# Patient Record
Sex: Male | Born: 1945 | Race: White | Hispanic: No | Marital: Married | Smoking: Never smoker
Health system: Southern US, Community
[De-identification: ages and names within clinical notes are randomized; demographics above are authoritative.]

## PROBLEM LIST (undated history)

## (undated) DIAGNOSIS — R1310 Dysphagia, unspecified: Secondary | ICD-10-CM

## (undated) DIAGNOSIS — I6521 Occlusion and stenosis of right carotid artery: Secondary | ICD-10-CM

## (undated) DIAGNOSIS — E785 Hyperlipidemia, unspecified: Secondary | ICD-10-CM

## (undated) DIAGNOSIS — R42 Dizziness and giddiness: Secondary | ICD-10-CM

## (undated) DIAGNOSIS — I493 Ventricular premature depolarization: Secondary | ICD-10-CM

## (undated) DIAGNOSIS — R600 Localized edema: Secondary | ICD-10-CM

## (undated) DIAGNOSIS — E039 Hypothyroidism, unspecified: Secondary | ICD-10-CM

## (undated) DIAGNOSIS — K5909 Other constipation: Secondary | ICD-10-CM

## (undated) DIAGNOSIS — I10 Essential (primary) hypertension: Secondary | ICD-10-CM

## (undated) DIAGNOSIS — E538 Deficiency of other specified B group vitamins: Secondary | ICD-10-CM

## (undated) DIAGNOSIS — Z6828 Body mass index (BMI) 28.0-28.9, adult: Secondary | ICD-10-CM

## (undated) DIAGNOSIS — F419 Anxiety disorder, unspecified: Secondary | ICD-10-CM

## (undated) DIAGNOSIS — R55 Syncope and collapse: Secondary | ICD-10-CM

## (undated) DIAGNOSIS — Z87442 Personal history of urinary calculi: Secondary | ICD-10-CM

## (undated) DIAGNOSIS — N1831 Chronic kidney disease, stage 3a: Secondary | ICD-10-CM

## (undated) DIAGNOSIS — J309 Allergic rhinitis, unspecified: Secondary | ICD-10-CM

## (undated) DIAGNOSIS — N400 Enlarged prostate without lower urinary tract symptoms: Secondary | ICD-10-CM

## (undated) DIAGNOSIS — M159 Polyosteoarthritis, unspecified: Secondary | ICD-10-CM

## (undated) DIAGNOSIS — R0600 Dyspnea, unspecified: Secondary | ICD-10-CM

## (undated) DIAGNOSIS — Z6823 Body mass index (BMI) 23.0-23.9, adult: Secondary | ICD-10-CM

## (undated) DIAGNOSIS — E291 Testicular hypofunction: Secondary | ICD-10-CM

## (undated) HISTORY — DX: Localized edema: R60.0

## (undated) HISTORY — DX: Chronic kidney disease, stage 3a: N18.31

## (undated) HISTORY — DX: Syncope and collapse: R55

## (undated) HISTORY — DX: Essential (primary) hypertension: I10

## (undated) HISTORY — DX: Dyspnea, unspecified: R06.00

## (undated) HISTORY — DX: Dizziness and giddiness: R42

## (undated) HISTORY — DX: Benign prostatic hyperplasia without lower urinary tract symptoms: N40.0

## (undated) HISTORY — DX: Polyosteoarthritis, unspecified: M15.9

## (undated) HISTORY — DX: Body mass index (BMI) 28.0-28.9, adult: Z68.28

## (undated) HISTORY — DX: Testicular hypofunction: E29.1

## (undated) HISTORY — DX: Body mass index (BMI) 23.0-23.9, adult: Z68.23

## (undated) HISTORY — DX: Hyperlipidemia, unspecified: E78.5

## (undated) HISTORY — DX: Occlusion and stenosis of right carotid artery: I65.21

## (undated) HISTORY — DX: Anxiety disorder, unspecified: F41.9

## (undated) HISTORY — PX: KIDNEY STONE SURGERY: SHX686

## (undated) HISTORY — DX: Other constipation: K59.09

## (undated) HISTORY — DX: Allergic rhinitis, unspecified: J30.9

## (undated) HISTORY — DX: Hypothyroidism, unspecified: E03.9

## (undated) HISTORY — DX: Dysphagia, unspecified: R13.10

## (undated) HISTORY — DX: Ventricular premature depolarization: I49.3

## (undated) HISTORY — DX: Deficiency of other specified B group vitamins: E53.8

---

## 2014-12-30 DIAGNOSIS — B351 Tinea unguium: Secondary | ICD-10-CM | POA: Diagnosis not present

## 2014-12-30 DIAGNOSIS — Z6828 Body mass index (BMI) 28.0-28.9, adult: Secondary | ICD-10-CM | POA: Diagnosis not present

## 2014-12-30 DIAGNOSIS — R030 Elevated blood-pressure reading, without diagnosis of hypertension: Secondary | ICD-10-CM | POA: Diagnosis not present

## 2014-12-30 DIAGNOSIS — R5382 Chronic fatigue, unspecified: Secondary | ICD-10-CM | POA: Diagnosis not present

## 2014-12-30 DIAGNOSIS — R410 Disorientation, unspecified: Secondary | ICD-10-CM | POA: Diagnosis not present

## 2015-01-10 DIAGNOSIS — H524 Presbyopia: Secondary | ICD-10-CM | POA: Diagnosis not present

## 2015-01-10 DIAGNOSIS — H40003 Preglaucoma, unspecified, bilateral: Secondary | ICD-10-CM | POA: Diagnosis not present

## 2015-01-10 DIAGNOSIS — H5210 Myopia, unspecified eye: Secondary | ICD-10-CM | POA: Diagnosis not present

## 2015-07-21 DIAGNOSIS — H40003 Preglaucoma, unspecified, bilateral: Secondary | ICD-10-CM | POA: Diagnosis not present

## 2015-11-10 DIAGNOSIS — R5382 Chronic fatigue, unspecified: Secondary | ICD-10-CM | POA: Diagnosis not present

## 2015-11-10 DIAGNOSIS — R410 Disorientation, unspecified: Secondary | ICD-10-CM | POA: Diagnosis not present

## 2015-11-10 DIAGNOSIS — Z6828 Body mass index (BMI) 28.0-28.9, adult: Secondary | ICD-10-CM | POA: Diagnosis not present

## 2015-11-10 DIAGNOSIS — R030 Elevated blood-pressure reading, without diagnosis of hypertension: Secondary | ICD-10-CM | POA: Diagnosis not present

## 2015-11-10 DIAGNOSIS — Z1389 Encounter for screening for other disorder: Secondary | ICD-10-CM | POA: Diagnosis not present

## 2015-11-10 DIAGNOSIS — E663 Overweight: Secondary | ICD-10-CM | POA: Diagnosis not present

## 2016-09-07 DIAGNOSIS — M25511 Pain in right shoulder: Secondary | ICD-10-CM | POA: Diagnosis not present

## 2016-09-07 DIAGNOSIS — Z6827 Body mass index (BMI) 27.0-27.9, adult: Secondary | ICD-10-CM | POA: Diagnosis not present

## 2016-09-07 DIAGNOSIS — R030 Elevated blood-pressure reading, without diagnosis of hypertension: Secondary | ICD-10-CM | POA: Diagnosis not present

## 2016-09-07 DIAGNOSIS — E663 Overweight: Secondary | ICD-10-CM | POA: Diagnosis not present

## 2017-10-05 DIAGNOSIS — M79605 Pain in left leg: Secondary | ICD-10-CM | POA: Diagnosis not present

## 2017-10-05 DIAGNOSIS — M25552 Pain in left hip: Secondary | ICD-10-CM | POA: Diagnosis not present

## 2017-10-05 DIAGNOSIS — R030 Elevated blood-pressure reading, without diagnosis of hypertension: Secondary | ICD-10-CM | POA: Diagnosis not present

## 2017-10-05 DIAGNOSIS — Z6828 Body mass index (BMI) 28.0-28.9, adult: Secondary | ICD-10-CM | POA: Diagnosis not present

## 2017-12-08 DIAGNOSIS — I493 Ventricular premature depolarization: Secondary | ICD-10-CM | POA: Diagnosis not present

## 2017-12-08 DIAGNOSIS — R030 Elevated blood-pressure reading, without diagnosis of hypertension: Secondary | ICD-10-CM | POA: Diagnosis not present

## 2017-12-08 DIAGNOSIS — J309 Allergic rhinitis, unspecified: Secondary | ICD-10-CM | POA: Diagnosis not present

## 2017-12-08 DIAGNOSIS — R5382 Chronic fatigue, unspecified: Secondary | ICD-10-CM | POA: Diagnosis not present

## 2017-12-08 DIAGNOSIS — R002 Palpitations: Secondary | ICD-10-CM | POA: Diagnosis not present

## 2017-12-08 DIAGNOSIS — Z6828 Body mass index (BMI) 28.0-28.9, adult: Secondary | ICD-10-CM | POA: Diagnosis not present

## 2017-12-14 DIAGNOSIS — R002 Palpitations: Secondary | ICD-10-CM | POA: Diagnosis not present

## 2017-12-23 ENCOUNTER — Other Ambulatory Visit: Payer: Self-pay

## 2018-01-09 DIAGNOSIS — Z6827 Body mass index (BMI) 27.0-27.9, adult: Secondary | ICD-10-CM | POA: Diagnosis not present

## 2018-01-09 DIAGNOSIS — R030 Elevated blood-pressure reading, without diagnosis of hypertension: Secondary | ICD-10-CM | POA: Diagnosis not present

## 2018-01-09 DIAGNOSIS — J309 Allergic rhinitis, unspecified: Secondary | ICD-10-CM | POA: Diagnosis not present

## 2018-01-09 DIAGNOSIS — I493 Ventricular premature depolarization: Secondary | ICD-10-CM | POA: Diagnosis not present

## 2018-01-10 ENCOUNTER — Telehealth: Payer: Self-pay | Admitting: *Deleted

## 2018-01-10 NOTE — Telephone Encounter (Signed)
Dr. Tomie Chinaevankar advised that patient needed an appointment as Dr. Nedra HaiLee is referring patient to be seen by Dr. Tomie Chinaevankar. Patient has already been scheduled for an appointment on 01/16/18 at 10:20. Provided address to patient. No further questions.

## 2018-01-16 ENCOUNTER — Ambulatory Visit (INDEPENDENT_AMBULATORY_CARE_PROVIDER_SITE_OTHER): Payer: Medicare HMO | Admitting: Cardiology

## 2018-01-16 ENCOUNTER — Encounter: Payer: Self-pay | Admitting: Cardiology

## 2018-01-16 VITALS — BP 126/72 | HR 67 | Ht 74.0 in | Wt 204.0 lb

## 2018-01-16 DIAGNOSIS — I493 Ventricular premature depolarization: Secondary | ICD-10-CM

## 2018-01-16 DIAGNOSIS — R0609 Other forms of dyspnea: Secondary | ICD-10-CM | POA: Diagnosis not present

## 2018-01-16 DIAGNOSIS — R0989 Other specified symptoms and signs involving the circulatory and respiratory systems: Secondary | ICD-10-CM

## 2018-01-16 HISTORY — DX: Ventricular premature depolarization: I49.3

## 2018-01-16 NOTE — Progress Notes (Signed)
Cardiology Office Note:    Date:  01/16/2018   ID:  Robert Roy, DOB 11/01/1945, MRN [id number]  PCP:  Simone Curia, MD  Cardiologist:  Garwin Brothers, MD   Referring MD: Simone Curia, MD    ASSESSMENT:    1. PVC (premature ventricular contraction)   2. Carotid bruit, unspecified laterality   3. DOE (dyspnea on exertion)    PLAN:    In order of problems listed above:  1. Primary prevention stressed with the patient.  Importance of compliance with diet and medications stressed and he vocalized understanding.  His blood pressure is stable.  He gives some history suggesting orthostatic hypotension especially when she tries to get up from laying down position.  I asked him to keep himself well-hydrated and use a little extra salt in his diet in the warm summer months especially when he works out outside. 2. He will have bilateral carotid Doppler to evaluate his bruit.  Echocardiogram will be done to assess murmur on auscultation 3. The patient will undergo exercise stress echo to assess symptoms of dyspnea on exertion.  He does not appear to be concerning for coronary etiology based on his description.  He has no chest pain or chest tightness. 4. His blood work is followed by his primary care physician.  He knows to go to the nearest emergency room for any concerning symptoms.  He will be seen in follow-up appointment in a month or earlier if he has any concerns I reviewed his Holter monitor also and answered questions to his satisfaction   Medication Adjustments/Labs and Tests Ordered: Current medicines are reviewed at length with the patient today.  Concerns regarding medicines are outlined above.  No orders of the defined types were placed in this encounter.  No orders of the defined types were placed in this encounter.    No chief complaint on file.    History of Present Illness:    Robert Roy is a 72 y.o. male..  Patient is a pleasant 72 year old male.  He has past medical  history that is not much significant.  He is an active gentleman.  He mentions to me that he noticed a skipped beat like sensation and was found to have multiple PVCs on the monitor that is why he was referred here for evaluation.  No chest pain orthopnea or PND.  He has some shortness of breath on exertion.  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  Them.  At the time of my evaluation, the patient is alert awake oriented and in no distress.  Past Medical History:  Diagnosis Date  . BMI 28.0-28.9,adult   . Hypertension     Past Surgical History:  Procedure Laterality Date  . KIDNEY STONE SURGERY      Current Medications: No outpatient medications have been marked as taking for the 01/16/18 encounter (Office Visit) with Matilyn Fehrman, Aundra Dubin, MD.     Allergies:   Patient has no known allergies.   Social History   Socioeconomic History  . Marital status: Unknown    Spouse name: Not on file  . Number of children: Not on file  . Years of education: Not on file  . Highest education level: Not on file  Occupational History  . Not on file  Social Needs  . Financial resource strain: Not on file  . Food insecurity:    Worry: Not on file    Inability: Not  on file  . Transportation needs:    Medical: Not on file    Non-medical: Not on file  Tobacco Use  . Smoking status: Never Smoker  . Smokeless tobacco: Never Used  Substance and Sexual Activity  . Alcohol use: Not on file  . Drug use: Not on file  . Sexual activity: Not on file  Lifestyle  . Physical activity:    Days per week: Not on file    Minutes per session: Not on file  . Stress: Not on file  Relationships  . Social connections:    Talks on phone: Not on file    Gets together: Not on file    Attends religious service: Not on file    Active member of club or organization: Not on file    Attends meetings of clubs or organizations: Not on file    Relationship status: Not  on file  Other Topics Concern  . Not on file  Social History Narrative  . Not on file     Family History: The patient's family history includes Heart disease in his father; Hypertension in his mother; Prostate cancer in his father.  ROS:   Please see the history of present illness.    All other systems reviewed and are negative.  EKGs/Labs/Other Studies Reviewed:    The following studies were reviewed today: EKG reveals sinus rhythm and nonspecific ST-T changes.   Recent Labs: No results found for requested labs within last 8760 hours.  Recent Lipid Panel No results found for: CHOL, TRIG, HDL, CHOLHDL, VLDL, LDLCALC, LDLDIRECT  Physical Exam:    VS:  BP 126/72 (BP Location: Right Arm, Patient Position: Sitting, Cuff Size: Normal)   Pulse 67   Ht 6\' 2"  (1.88 m)   Wt 204 lb (92.5 kg)   SpO2 98%   BMI 26.19 kg/m     Wt Readings from Last 3 Encounters:  01/16/18 204 lb (92.5 kg)     GEN: Patient is in no acute distress HEENT: Normal NECK: No JVD; bilateral carotid bruits, left greater than right. LYMPHATICS: No lymphadenopathy CARDIAC: Hear sounds regular, 2/6 systolic murmur at the apex. RESPIRATORY:  Clear to auscultation without rales, wheezing or rhonchi  ABDOMEN: Soft, non-tender, non-distended MUSCULOSKELETAL:  No edema; No deformity  SKIN: Warm and dry NEUROLOGIC:  Alert and oriented x 3 PSYCHIATRIC:  Normal affect   Signed, Garwin Brothersajan R Sona Nations, MD  01/16/2018 11:32 AM    [facility]

## 2018-01-16 NOTE — Patient Instructions (Signed)
Medication Instructions:  Your physician recommends that you continue on your current medications as directed. Please refer to the Current Medication list given to you today.  Labwork: None  Testing/Procedures: Your physician has requested that you have an echocardiogram. Echocardiography is a painless test that uses sound waves to create images of your heart. It provides your doctor with information about the size and shape of your heart and how well your heart's chambers and valves are working. This procedure takes approximately one hour. There are no restrictions for this procedure.  Your physician has requested that you have a stress echocardiogram. For further information please visit [email or web address]. Please follow instruction sheet as given.  Your physician has requested that you have a carotid duplex. This test is an ultrasound of the carotid arteries in your neck. It looks at blood flow through these arteries that supply the brain with blood. Allow one hour for this exam. There are no restrictions or special instructions.  Follow-Up: Your physician recommends that you schedule a follow-up appointment in: 1 month Any Other Special Instructions Will Be Listed Below (If Applicable).     If you need a refill on your cardiac medications before your next appointment, please call your pharmacy.   [facility]  Garey HamAshley A, RN, BSN

## 2018-01-18 NOTE — Addendum Note (Signed)
Addended by: Craige CottaANDERSON, ASHLEY S on: 01/18/2018 08:46 AM   Modules accepted: Orders

## 2018-02-14 ENCOUNTER — Other Ambulatory Visit: Payer: Medicare HMO

## 2018-02-17 ENCOUNTER — Other Ambulatory Visit: Payer: Medicare HMO

## 2018-02-27 ENCOUNTER — Ambulatory Visit: Payer: Medicare HMO | Admitting: Cardiology

## 2018-03-09 DIAGNOSIS — H40003 Preglaucoma, unspecified, bilateral: Secondary | ICD-10-CM | POA: Diagnosis not present

## 2018-03-09 DIAGNOSIS — H524 Presbyopia: Secondary | ICD-10-CM | POA: Diagnosis not present

## 2018-03-28 ENCOUNTER — Ambulatory Visit (INDEPENDENT_AMBULATORY_CARE_PROVIDER_SITE_OTHER): Payer: Medicare HMO

## 2018-03-28 ENCOUNTER — Other Ambulatory Visit: Payer: Self-pay

## 2018-03-28 DIAGNOSIS — R0609 Other forms of dyspnea: Secondary | ICD-10-CM | POA: Diagnosis not present

## 2018-03-28 DIAGNOSIS — I493 Ventricular premature depolarization: Secondary | ICD-10-CM | POA: Diagnosis not present

## 2018-03-28 DIAGNOSIS — R0989 Other specified symptoms and signs involving the circulatory and respiratory systems: Secondary | ICD-10-CM | POA: Diagnosis not present

## 2018-03-28 NOTE — Progress Notes (Signed)
Complete echocardiogram has been performed.  Jimmy Earline Stiner, RDCS, RVT 

## 2018-03-28 NOTE — Progress Notes (Signed)
Complete carotid duplex exam has been performed. ICA stenosis was seen and evaluated bilaterally.  Jimmy Verna Desrocher RDCS, RVT

## 2018-03-29 ENCOUNTER — Ambulatory Visit (INDEPENDENT_AMBULATORY_CARE_PROVIDER_SITE_OTHER): Payer: Medicare HMO

## 2018-03-29 DIAGNOSIS — R0989 Other specified symptoms and signs involving the circulatory and respiratory systems: Secondary | ICD-10-CM | POA: Diagnosis not present

## 2018-03-29 DIAGNOSIS — R0609 Other forms of dyspnea: Secondary | ICD-10-CM | POA: Diagnosis not present

## 2018-03-29 DIAGNOSIS — I493 Ventricular premature depolarization: Secondary | ICD-10-CM

## 2018-03-29 NOTE — Progress Notes (Signed)
Stress echocardiogram has Been performed.  Jimmy Alyannah Sanks RDCS, RVT

## 2018-03-30 ENCOUNTER — Ambulatory Visit (INDEPENDENT_AMBULATORY_CARE_PROVIDER_SITE_OTHER): Payer: Medicare HMO | Admitting: Cardiology

## 2018-03-30 ENCOUNTER — Encounter: Payer: Self-pay | Admitting: Cardiology

## 2018-03-30 VITALS — BP 140/82 | HR 82 | Ht 74.0 in | Wt 205.0 lb

## 2018-03-30 DIAGNOSIS — I493 Ventricular premature depolarization: Secondary | ICD-10-CM

## 2018-03-30 DIAGNOSIS — R0609 Other forms of dyspnea: Secondary | ICD-10-CM

## 2018-03-30 NOTE — Progress Notes (Signed)
Cardiology Office Note:    Date:  03/30/2018   ID:  Robert Roy, DOB [birth year], MRN [id number]  PCP:  Simone Curia, MD  Cardiologist:  Garwin Brothers, MD   Referring MD: Simone Curia, MD    ASSESSMENT:    1. PVC (premature ventricular contraction)   2. DOE (dyspnea on exertion)    PLAN:    In order of problems listed above:  1. Primary prevention stressed with the patient.  Importance of compliance with diet and medication stressed and he vocalized understanding.  His blood pressure is stable.  Diet was discussed and the importance of regular exercise stressed 2. He will have an annual physical with his primary care physician in the next few weeks and at that point he will have all blood work including lipids 3. He will be seen by me on a as needed basis only from now on.   Medication Adjustments/Labs and Tests Ordered: Current medicines are reviewed at length with the patient today.  Concerns regarding medicines are outlined above.  No orders of the defined types were placed in this encounter.  No orders of the defined types were placed in this encounter.    No chief complaint on file.    History of Present Illness:    Robert Roy is a 72 y.o. male.  Patient is here for follow-up.  He had a history of some skipped beat sensation.  No chest pain orthopnea or PND.  The patient underwent echocardiogram and stress test and the results of these tests have come out fine.  Patient is very happy with it.  He denies any problems at this time and takes care of activities of daily living.  Past Medical History:  Diagnosis Date  . BMI 28.0-28.9,adult   . Hypertension     Past Surgical History:  Procedure Laterality Date  . KIDNEY STONE SURGERY      Current Medications: No outpatient medications have been marked as taking for the 03/30/18 encounter (Office Visit) with Revankar, Aundra Dubin, MD.     Allergies:   Patient has no known allergies.   Social History    Socioeconomic History  . Marital status: Unknown    Spouse name: Not on file  . Number of children: Not on file  . Years of education: Not on file  . Highest education level: Not on file  Occupational History  . Not on file  Social Needs  . Financial resource strain: Not on file  . Food insecurity:    Worry: Not on file    Inability: Not on file  . Transportation needs:    Medical: Not on file    Non-medical: Not on file  Tobacco Use  . Smoking status: Never Smoker  . Smokeless tobacco: Never Used  Substance and Sexual Activity  . Alcohol use: Not on file  . Drug use: Not on file  . Sexual activity: Not on file  Lifestyle  . Physical activity:    Days per week: Not on file    Minutes per session: Not on file  . Stress: Not on file  Relationships  . Social connections:    Talks on phone: Not on file    Gets together: Not on file    Attends religious service: Not on file    Active member of club or organization: Not on file    Attends meetings of clubs or organizations: Not on file    Relationship status: Not on file  Other  Topics Concern  . Not on file  Social History Narrative  . Not on file     Family History: The patient's family history includes Heart disease in his father; Hypertension in his mother; Prostate cancer in his father.  ROS:   Please see the history of present illness.    All other systems reviewed and are negative.  EKGs/Labs/Other Studies Reviewed:    The following studies were reviewed today: I discussed my findings with the patient at extensive length.   Recent Labs: No results found for requested labs within last 8760 hours.  Recent Lipid Panel No results found for: CHOL, TRIG, HDL, CHOLHDL, VLDL, LDLCALC, LDLDIRECT  Physical Exam:    VS:  BP 140/82 (BP Location: Right Arm, Patient Position: Sitting, Cuff Size: Normal)   Pulse 82   Ht 6\' 2"  (1.88 m)   Wt 205 lb (93 kg)   SpO2 98%   BMI 26.32 kg/m     Wt Readings from Last  3 Encounters:  03/30/18 205 lb (93 kg)  01/16/18 204 lb (92.5 kg)     GEN: Patient is in no acute distress HEENT: Normal NECK: No JVD; No carotid bruits LYMPHATICS: No lymphadenopathy CARDIAC: Hear sounds regular, 2/6 systolic murmur at the apex. RESPIRATORY:  Clear to auscultation without rales, wheezing or rhonchi  ABDOMEN: Soft, non-tender, non-distended MUSCULOSKELETAL:  No edema; No deformity  SKIN: Warm and dry NEUROLOGIC:  Alert and oriented x 3 PSYCHIATRIC:  Normal affect   Signed, Garwin Brothers, MD  03/30/2018 10:07 AM    [facility]

## 2018-03-30 NOTE — Patient Instructions (Addendum)
Medication Instructions:  Your physician recommends that you continue on your current medications as directed. Please refer to the Current Medication list given to you today.   Labwork: None  Testing/Procedures: None  Follow-Up: Your physician recommends that you schedule a follow-up appointment in: as needed if symptoms worsen or fail to improve give [place] a call   Any Other Special Instructions Will Be Listed Below (If Applicable).     If you need a refill on your cardiac medications before your next appointment, please call your pharmacy.

## 2018-05-22 ENCOUNTER — Other Ambulatory Visit: Payer: Self-pay

## 2018-05-22 DIAGNOSIS — R9439 Abnormal result of other cardiovascular function study: Secondary | ICD-10-CM

## 2018-05-22 LAB — BASIC METABOLIC PANEL
BUN / CREAT RATIO: 13 (ref 10–24)
BUN: 17 mg/dL (ref 8–27)
CO2: 24 mmol/L (ref 20–29)
Calcium: 10.7 mg/dL — ABNORMAL HIGH (ref 8.6–10.2)
Chloride: 102 mmol/L (ref 96–106)
Creatinine, Ser: 1.33 mg/dL — ABNORMAL HIGH (ref 0.76–1.27)
GFR calc non Af Amer: 54 mL/min/{1.73_m2} — ABNORMAL LOW (ref >59)
GFR, EST AFRICAN AMERICAN: 63 mL/min/{1.73_m2} (ref >59)
Glucose: 87 mg/dL (ref 65–99)
POTASSIUM: 4.6 mmol/L (ref 3.5–5.2)
Sodium: 139 mmol/L (ref 134–144)

## 2018-05-23 NOTE — Progress Notes (Signed)
Informed patient of the results. Educated regarding creatinine and the risks for proceeding with CTA, patient wished to re-visit the testing at a later date to allow his creatinine to improve. Reinforced education with patient of the importance on following up with the carotid CTA in a timely fashion, patient was agreeable to not wait to long to follow up. Patient states he will call back to have blood work re-drawn for the CTA to be completed.

## 2018-06-05 DIAGNOSIS — N401 Enlarged prostate with lower urinary tract symptoms: Secondary | ICD-10-CM | POA: Diagnosis not present

## 2018-06-05 DIAGNOSIS — R351 Nocturia: Secondary | ICD-10-CM | POA: Diagnosis not present

## 2018-07-20 DIAGNOSIS — N401 Enlarged prostate with lower urinary tract symptoms: Secondary | ICD-10-CM | POA: Diagnosis not present

## 2018-07-20 DIAGNOSIS — R351 Nocturia: Secondary | ICD-10-CM | POA: Diagnosis not present

## 2018-07-20 DIAGNOSIS — Z125 Encounter for screening for malignant neoplasm of prostate: Secondary | ICD-10-CM | POA: Diagnosis not present

## 2018-08-09 ENCOUNTER — Telehealth: Payer: Self-pay | Admitting: Cardiology

## 2018-08-09 DIAGNOSIS — I6529 Occlusion and stenosis of unspecified carotid artery: Secondary | ICD-10-CM

## 2018-08-09 DIAGNOSIS — R9439 Abnormal result of other cardiovascular function study: Secondary | ICD-10-CM

## 2018-08-09 DIAGNOSIS — Z01812 Encounter for preprocedural laboratory examination: Secondary | ICD-10-CM

## 2018-08-09 HISTORY — DX: Occlusion and stenosis of unspecified carotid artery: I65.29

## 2018-08-09 NOTE — Telephone Encounter (Signed)
Patient came into the office today and is ready to schedule his CT.. please start process and notify patient.

## 2018-08-09 NOTE — Telephone Encounter (Signed)
Attempted to contact patient with no answer, then patient called right back. Patient states he does not want to have testing done at [facility]. Will schedule CTA neck w/wo contrast to happen at the [facility]. Patient is agreeable, address and directions provided. Precert message sent. Advised patient once we receive insurance approval, he will be contacted to schedule testing.   Patient advised once testing is scheduled to go to our [place] office for lab work, no appointment needed. No need to fast beforehand. BMP order has been placed.   Patient verbalized understanding. No further questions.

## 2018-08-10 NOTE — Telephone Encounter (Signed)
Patient has been scheduled for the CTA neck on Monday, 08/14/2018 at 10:00 am [facility]. He will come to the [place] office for lab work tomorrow. No further questions.

## 2018-08-11 ENCOUNTER — Telehealth: Payer: Self-pay

## 2018-08-11 DIAGNOSIS — R9439 Abnormal result of other cardiovascular function study: Secondary | ICD-10-CM | POA: Diagnosis not present

## 2018-08-11 DIAGNOSIS — Z01812 Encounter for preprocedural laboratory examination: Secondary | ICD-10-CM | POA: Diagnosis not present

## 2018-08-11 LAB — BASIC METABOLIC PANEL
BUN/Creatinine Ratio: 14 (ref 10–24)
BUN: 19 mg/dL (ref 8–27)
CALCIUM: 10.6 mg/dL — AB (ref 8.6–10.2)
CHLORIDE: 105 mmol/L (ref 96–106)
CO2: 24 mmol/L (ref 20–29)
Creatinine, Ser: 1.35 mg/dL — ABNORMAL HIGH (ref 0.76–1.27)
GFR calc Af Amer: 61 mL/min/{1.73_m2} (ref >59)
GFR calc non Af Amer: 53 mL/min/{1.73_m2} — ABNORMAL LOW (ref >59)
GLUCOSE: 90 mg/dL (ref 65–99)
Potassium: 4.5 mmol/L (ref 3.5–5.2)
SODIUM: 144 mmol/L (ref 134–144)

## 2018-08-11 NOTE — Telephone Encounter (Signed)
Patient called and notified of test results. 

## 2018-08-11 NOTE — Telephone Encounter (Signed)
-----   Message from Garwin Brothers, MD sent at 08/11/2018  4:43 PM EST ----- The results of the study is unremarkable. Please inform patient. I will discuss in detail at next appointment. Cc  primary care/referring physician Garwin Brothers, MD 08/11/2018 4:43 PM

## 2018-08-14 ENCOUNTER — Telehealth: Payer: Self-pay | Admitting: Emergency Medicine

## 2018-08-14 ENCOUNTER — Ambulatory Visit (HOSPITAL_BASED_OUTPATIENT_CLINIC_OR_DEPARTMENT_OTHER)
Admission: RE | Admit: 2018-08-14 | Discharge: 2018-08-14 | Disposition: A | Discharge status: Home / Self Care | Payer: Medicare HMO | Source: Ambulatory Visit | Attending: Cardiology | Admitting: Cardiology

## 2018-08-14 ENCOUNTER — Encounter (HOSPITAL_BASED_OUTPATIENT_CLINIC_OR_DEPARTMENT_OTHER): Payer: Self-pay

## 2018-08-14 DIAGNOSIS — R0609 Other forms of dyspnea: Secondary | ICD-10-CM

## 2018-08-14 DIAGNOSIS — R9439 Abnormal result of other cardiovascular function study: Secondary | ICD-10-CM

## 2018-08-14 DIAGNOSIS — I6522 Occlusion and stenosis of left carotid artery: Secondary | ICD-10-CM | POA: Diagnosis not present

## 2018-08-14 DIAGNOSIS — I6529 Occlusion and stenosis of unspecified carotid artery: Secondary | ICD-10-CM

## 2018-08-14 MED ORDER — IOPAMIDOL (ISOVUE-370) INJECTION 76%
100.0000 mL | Freq: Once | INTRAVENOUS | Status: AC | PRN
  Administered 2018-08-14: 100 mL via INTRAVENOUS

## 2018-08-14 NOTE — Telephone Encounter (Signed)
Per Dr. Tomie China vascular surgery for abnormal carotid duplex ordered along with lab work. Dr. Tomie China informed patient of this.

## 2018-08-16 DIAGNOSIS — R0609 Other forms of dyspnea: Secondary | ICD-10-CM | POA: Diagnosis not present

## 2018-08-16 DIAGNOSIS — R9439 Abnormal result of other cardiovascular function study: Secondary | ICD-10-CM | POA: Diagnosis not present

## 2018-08-16 DIAGNOSIS — I6529 Occlusion and stenosis of unspecified carotid artery: Secondary | ICD-10-CM | POA: Diagnosis not present

## 2018-08-17 LAB — BASIC METABOLIC PANEL
BUN/Creatinine Ratio: 10 (ref 10–24)
BUN: 15 mg/dL (ref 8–27)
CO2: 21 mmol/L (ref 20–29)
Calcium: 10.3 mg/dL — ABNORMAL HIGH (ref 8.6–10.2)
Chloride: 107 mmol/L — ABNORMAL HIGH (ref 96–106)
Creatinine, Ser: 1.47 mg/dL — ABNORMAL HIGH (ref 0.76–1.27)
GFR calc non Af Amer: 48 mL/min/{1.73_m2} — ABNORMAL LOW (ref >59)
GFR, EST AFRICAN AMERICAN: 55 mL/min/{1.73_m2} — AB (ref >59)
Glucose: 96 mg/dL (ref 65–99)
Potassium: 4.2 mmol/L (ref 3.5–5.2)
Sodium: 142 mmol/L (ref 134–144)

## 2018-08-17 LAB — HEPATIC FUNCTION PANEL
ALT: 13 IU/L (ref 0–44)
AST: 16 IU/L (ref 0–40)
Albumin: 4.1 g/dL (ref 3.8–4.8)
Alkaline Phosphatase: 78 IU/L (ref 39–117)
Bilirubin Total: 0.4 mg/dL (ref 0.0–1.2)
Bilirubin, Direct: 0.12 mg/dL (ref 0.00–0.40)
Total Protein: 6.2 g/dL (ref 6.0–8.5)

## 2018-08-17 LAB — LIPID PANEL
Chol/HDL Ratio: 3.5 ratio (ref 0.0–5.0)
Cholesterol, Total: 154 mg/dL (ref 100–199)
HDL: 44 mg/dL (ref >39)
LDL Calculated: 99 mg/dL (ref 0–99)
Triglycerides: 55 mg/dL (ref 0–149)
VLDL Cholesterol Cal: 11 mg/dL (ref 5–40)

## 2018-08-17 LAB — CBC
Hematocrit: 40.3 % (ref 37.5–51.0)
Hemoglobin: 13.7 g/dL (ref 13.0–17.7)
MCH: 30.5 pg (ref 26.6–33.0)
MCHC: 34 g/dL (ref 31.5–35.7)
MCV: 90 fL (ref 79–97)
Platelets: 183 10*3/uL (ref 150–450)
RBC: 4.49 x10E6/uL (ref 4.14–5.80)
RDW: 13.1 % (ref 11.6–15.4)
WBC: 4.8 10*3/uL (ref 3.4–10.8)

## 2018-08-17 LAB — TSH: TSH: 6.14 u[IU]/mL — ABNORMAL HIGH (ref 0.450–4.500)

## 2018-08-18 ENCOUNTER — Telehealth: Payer: Self-pay | Admitting: Cardiology

## 2018-08-18 NOTE — Telephone Encounter (Signed)
Patient would  like to see Vasular Surgeon closer to our town and not drive to [place],  Please call patient.

## 2018-08-18 NOTE — Telephone Encounter (Signed)
Please advise 

## 2018-08-21 ENCOUNTER — Telehealth: Payer: Self-pay

## 2018-08-21 DIAGNOSIS — R9439 Abnormal result of other cardiovascular function study: Secondary | ICD-10-CM

## 2018-08-21 NOTE — Telephone Encounter (Signed)
Per RRR pt agreed to see a Vascular surgeon In [place]. Referral placed. Pt has been informed of this and address and phone number was provided. Also informed pt that someone form that office should contact him with scheduling and any additional information he may needs. Pt is to call us if he has not heard anything within 1 week. Pt verbalized understanding.

## 2018-08-21 NOTE — Telephone Encounter (Signed)
He called me to tell me that he is fine to go to [place].

## 2018-08-21 NOTE — Telephone Encounter (Signed)
Referral have been placed and pt notified.

## 2018-08-29 ENCOUNTER — Other Ambulatory Visit: Payer: Self-pay

## 2018-08-29 ENCOUNTER — Ambulatory Visit (INDEPENDENT_AMBULATORY_CARE_PROVIDER_SITE_OTHER): Payer: Medicare HMO | Admitting: Vascular Surgery

## 2018-08-29 ENCOUNTER — Encounter (INDEPENDENT_AMBULATORY_CARE_PROVIDER_SITE_OTHER): Payer: Self-pay | Admitting: Vascular Surgery

## 2018-08-29 VITALS — BP 157/87 | HR 71 | Resp 12 | Ht 74.0 in | Wt 202.0 lb

## 2018-08-29 DIAGNOSIS — I6523 Occlusion and stenosis of bilateral carotid arteries: Secondary | ICD-10-CM | POA: Diagnosis not present

## 2018-08-29 DIAGNOSIS — R0609 Other forms of dyspnea: Secondary | ICD-10-CM

## 2018-08-29 DIAGNOSIS — I10 Essential (primary) hypertension: Secondary | ICD-10-CM

## 2018-08-29 NOTE — Patient Instructions (Signed)
Carotid Endarterectomy  A carotid endarterectomy is a surgery to remove a blockage in the carotid arteries. The carotid arteries are the large blood vessels on both sides of the neck that supply blood to the brain. Carotid artery disease, also called carotid artery stenosis, is the narrowing or blockage of one or both carotid arteries. Carotid artery disease is usually caused by atherosclerosis, which is a buildup of fat and plaques in the arteries. Some buildup of plaques normally occurs with aging. The plaques may partially or totally block blood flow or cause a clot to form in the carotid arteries. This may cause a stroke. Tell a health care provider about:  Any allergies you have.  All medicines you are taking, including vitamins, herbs, eye drops, creams, and over-the-counter medicines.  Any problems you or family members have had with anesthetic medicines.  Any blood disorders you have.  Any surgeries you have had.  Any medical conditions you have, including diabetes, kidney problems, and infections.  Whether you are pregnant or may be pregnant. What are the risks? Generally, this is a safe procedure. However, problems may occur, including:  Infection.  Bleeding.  Blood clots.  Allergic reactions to medicines.  Damage to nerves near the carotid arteries. This can cause a hoarse voice or weakness of muscles your face.  Stroke.  Seizures.  Heart attack (myocardial infarction).  Narrowing of the opened blood vessel (re-stenosis) . This may require another surgery. What happens before the procedure? Medicines  Ask your health care provider about: ? Changing or stopping your regular medicines. This is especially important if you are taking diabetes medicines or blood thinners. ? Taking medicines such as aspirin and ibuprofen. These medicines can thin your blood. Do not take these medicines before your procedure if your health care provider instructs you not to.  You may  be given antibiotic medicine to help prevent infection. Staying hydrated Follow instructions from your health care provider about hydration, which may include:  Up to 2 hours before the procedure - you may continue to drink clear liquids, such as water, clear fruit juice, black coffee, and plain tea. Eating and drinking restrictions Follow instructions from your health care provider about eating and drinking, which may include:  8 hours before the procedure - stop eating heavy meals or foods such as meat, fried foods, or fatty foods.  6 hours before the procedure - stop eating light meals or foods, such as toast or cereal.  6 hours before the procedure - stop drinking milk or drinks that contain milk.  2 hours before the procedure - stop drinking clear liquids. General instructions  Do not use any products that contain nicotine or tobacco, such as cigarettes and e-cigarettes. These can delay healing after surgery. If you need help quitting, ask your health care provider.  You may need to have blood tests, a test to check heart rhythm (electrocardiogram), or a test to check blood flow (angiogram).  Plan to have someone take you home from the hospital or clinic.  Ask your health care provider how your surgical site will be marked or identified.  You may be asked to shower with a germ-killing soap. What happens during the procedure?  To lower your risk of infection: ? Your health care team will wash or sanitize their hands. ? Your skin will be washed with soap. ? Hair may be removed from the surgical area.  An IV tube will be inserted into one of your veins.  You will be   given one or more of the following: ? A medicine to help you relax (sedative). ? A medicine to make you fall asleep (general anesthetic).  The surgeon will make a small incision in your neck to expose the carotid artery.  A tube may be inserted into the carotid artery above and below the blockage. This tube will  temporarily allow blood to flow around the blockage during the surgery.  An incision will be made in the carotid artery at the location of the blockage, then the blockage will be removed. In some cases, a section of the carotid artery may be removed and a graft patch may be used to repair the artery.  The carotid artery will be closed with stitches (sutures).  If a tube was inserted into the artery to allow blood flow around the blockage during surgery, the tube will be removed. With the tube removed, blood flow to the brain will be restored through the carotid artery.  The incision in the neck will be closed with sutures.  A bandage (dressing) will be placed over your incision. The procedure may vary among health care providers and hospitals. What happens after the procedure?  Your blood pressure, heart rate, breathing rate, and blood oxygen level will be monitored until the medicines you were given have worn off.  You may have some pain or an ache in your neck for a couple weeks. This is normal.  Do not drive for 24 hours if you were given a sedative. Summary  A carotid endarterectomy is a surgery to remove a blockage in the carotid arteries.  The carotid arteries are the large blood vessels on both sides of the neck that supply blood to the brain.  Before the procedure, ask your health care provider about changing or stopping your regular medicines.  Follow instructions from your health care provider about eating and drinking before the procedure.  After the procedure, do not drive for 24 hours if you were given a sedative. This information is not intended to replace advice given to you by your health care provider. Make sure you discuss any questions you have with your health care provider. Document Released: 02/21/2013 Document Revised: 05/10/2016 Document Reviewed: 05/10/2016 Elsevier Interactive Patient Education  2019 [employer]  

## 2018-08-29 NOTE — Progress Notes (Signed)
Patient ID: Robert Roy, male   DOB: 03-08-1946, 73 y.o.   MRN: [id number]  Chief Complaint  Patient presents with  . New Patient (Initial Visit)    HPI Robert Roy is a 73 y.o. male.  I am asked to see the patient by Revankar for evaluation of carotid stenosis.  The patient reports some dizzy headedness and some lack of energy intermittently.  No obvious focal neurologic deficits. Specifically, the patient denies amaurosis fugax, speech or swallowing difficulties, or arm or leg weakness or numbness.  After a visit with his regular doctor a CT scan was ordered for evaluation of carotid artery stenosis. I have independently reviewed the patient's CT angiogram of his carotid arteries.  The official report is of 70 to 80% right carotid artery stenosis but it is clearly worse than that and is ulcerated.  His left carotid artery stenosis is fairly mild appears to be less than 50%.   Past Medical History:  Diagnosis Date  . BMI 28.0-28.9,adult   . Hypertension     Past Surgical History:  Procedure Laterality Date  . KIDNEY STONE SURGERY      Family History  Problem Relation Age of Onset  . Hypertension Mother   . Heart disease Father   . Prostate cancer Father   No bleeding or clotting disorders  Social History Social History   Tobacco Use  . Smoking status: Never Smoker  . Smokeless tobacco: Never Used  Substance Use Topics  . Alcohol use: Not on file  . Drug use: Not on file  Married No drug use  No Known Allergies  Current Outpatient Medications  Medication Sig Dispense Refill  . tamsulosin (FLOMAX) 0.4 MG CAPS capsule Take 0.4 mg by mouth daily.     No current facility-administered medications for this visit.       REVIEW OF SYSTEMS (Negative unless checked)  Constitutional: Weight loss  Fever  Chills Cardiac: Chest pain   Chest pressure   Palpitations   Shortness of breath when laying flat   Shortness of breath at rest   Shortness of  breath with exertion. Vascular:  Pain in legs with walking   Pain in legs at rest   Pain in legs when laying flat   Claudication   Pain in feet when walking  Pain in feet at rest  Pain in feet when laying flat   History of DVT   Phlebitis   Swelling in legs   Varicose veins   Non-healing ulcers Pulmonary:   Uses home oxygen   Productive cough   Hemoptysis   Wheeze  COPD   Asthma Neurologic:  Dizziness  Blackouts   Seizures   History of stroke   History of TIA  Aphasia   Temporary blindness   Dysphagia   Weakness or numbness in arms   Weakness or numbness in legs Musculoskeletal:  Arthritis   Joint swelling   Joint pain   Low back pain Hematologic:  Easy bruising  Easy bleeding   Hypercoagulable state   Anemic  Hepatitis Gastrointestinal:  Blood in stool   Vomiting blood  Gastroesophageal reflux/heartburn   Abdominal pain Genitourinary:  Chronic kidney disease   Difficult urination  Frequent urination  Burning with urination   Hematuria Skin:  Rashes   Ulcers   Wounds Psychological:  History of anxiety    History of major depression.    Physical Exam BP (!) 157/87 (BP Location: Left Arm, Patient Position: Sitting, Cuff Size: Small)  Pulse 71   Resp 12   Ht 6\' 2"  (1.88 m)   Wt 202 lb (91.6 kg)   BMI 25.94 kg/m  Gen:  WD/WN, NAD Head: [place]/AT, No temporalis wasting.  Ear/Nose/Throat: Hearing grossly intact, nares w/o erythema or drainage, oropharynx w/o Erythema/Exudate Eyes: Conjunctiva clear, sclera non-icteric  Neck: trachea midline.  Right carotid bruit Pulmonary:  Good air movement, clear to auscultation bilaterally.  Cardiac: RRR, normal S1, S2 Vascular: * Vessel Right Left  Radial Palpable Palpable                          PT Palpable  1+ palpable  DP Palpable Palpable   Gastrointestinal: soft, non-tender/non-distended. Musculoskeletal: M/S 5/5 throughout.  Extremities  without ischemic changes.  No deformity or atrophy.  No edema. Neurologic: Sensation grossly intact in extremities.  Symmetrical.  Speech is fluent. Motor exam as listed above. Psychiatric: Judgment intact, Mood & affect appropriate for pt's clinical situation. Dermatologic: No rashes or ulcers noted.  No cellulitis or open wounds.   Radiology Ct Angio Neck W Or Wo Contrast  Result Date: 08/14/2018 CLINICAL DATA:  Follow-up carotid stenosis. EXAM: CT ANGIOGRAPHY NECK TECHNIQUE: Multidetector CT imaging of the neck was performed using the standard protocol during bolus administration of intravenous contrast. Multiplanar CT image reconstructions and MIPs were obtained to evaluate the vascular anatomy. Carotid stenosis measurements (when applicable) are obtained utilizing NASCET criteria, using the distal internal carotid diameter as the denominator. CONTRAST:  ISOVUE-370 IOPAMIDOL (ISOVUE-370) INJECTION 76% COMPARISON:  None. FINDINGS: Aortic arch: Atherosclerotic plaque. No dilatation or dissection. Three vessel branching. Right carotid system: Atheromatous wall thickening of the common carotid and mainly low-density plaque at the bulb with deep ulceration. The proximal ICA is narrowed by 70-80%. Left carotid system: Atheromatous wall thickening of the common carotid with mainly low-density plaque at the bulb. Left ICA origin stenosis measures 40%. Vertebral arteries: No proximal subclavian flow limiting stenosis. Advanced narrowing of the left vertebral origin due to atherosclerotic plaque, ~ 80%. Subjective mild to moderate narrowing of the smaller right vertebral origin. Skeleton: Disc degeneration and facet arthropathy. No acute or aggressive finding Other neck: Right tracheoesophageal groove nodule measuring 13 mm, most likely an exophytic thyroid nodule, below size threshold for sonographic follow-up recommendations per consensus guidelines. Upper chest: No acute finding IMPRESSION: 1. 70-80%  right ICA bulb narrowing due to ulcerated plaque. 2. At least 80% narrowing at the origin of the dominant left vertebral artery. 3. 40% left ICA origin stenosis. 4. Mild to moderate right vertebral origin stenosis. Electronically Signed   By: Marnee Spring M.D.   On: 08/14/2018 09:51    Labs Recent Results (from the past 2160 hour(s))  Basic Metabolic Panel (BMET)     Status: Abnormal   Collection Time: 08/11/18  9:33 AM  Result Value Ref Range   Glucose 90 65 - 99 mg/dL   BUN 19 8 - 27 mg/dL   Creatinine, Ser 5.36 (H) 0.76 - 1.27 mg/dL   GFR calc non Af Amer 53 (L) >59 mL/Roy/1.73   GFR calc Af Amer 61 >59 mL/Roy/1.73   BUN/Creatinine Ratio 14 10 - 24   Sodium 144 134 - 144 mmol/L   Potassium 4.5 3.5 - 5.2 mmol/L   Chloride 105 96 - 106 mmol/L   CO2 24 20 - 29 mmol/L   Calcium 10.6 (H) 8.6 - 10.2 mg/dL  Basic metabolic panel     Status: Abnormal   Collection  Time: 08/16/18  8:20 AM  Result Value Ref Range   Glucose 96 65 - 99 mg/dL   BUN 15 8 - 27 mg/dL   Creatinine, Ser 4.091.47 (H) 0.76 - 1.27 mg/dL   GFR calc non Af Amer 48 (L) >59 mL/Roy/1.73   GFR calc Af Amer 55 (L) >59 mL/Roy/1.73   BUN/Creatinine Ratio 10 10 - 24   Sodium 142 134 - 144 mmol/L   Potassium 4.2 3.5 - 5.2 mmol/L   Chloride 107 (H) 96 - 106 mmol/L   CO2 21 20 - 29 mmol/L   Calcium 10.3 (H) 8.6 - 10.2 mg/dL  CBC     Status: None   Collection Time: 08/16/18  8:20 AM  Result Value Ref Range   WBC 4.8 3.4 - 10.8 x10E3/uL   RBC 4.49 4.14 - 5.80 x10E6/uL   Hemoglobin 13.7 13.0 - 17.7 g/dL   Hematocrit 81.140.3 91.437.5 - 51.0 %   MCV 90 79 - 97 fL   MCH 30.5 26.6 - 33.0 pg   MCHC 34.0 31.5 - 35.7 g/dL   RDW 78.213.1 95.611.6 - 21.315.4 %   Platelets 183 150 - 450 x10E3/uL  TSH     Status: Abnormal   Collection Time: 08/16/18  8:20 AM  Result Value Ref Range   TSH 6.140 (H) 0.450 - 4.500 uIU/mL  Hepatic function panel     Status: None   Collection Time: 08/16/18  8:20 AM  Result Value Ref Range   Total Protein 6.2 6.0 -  8.5 g/dL   Albumin 4.1 3.8 - 4.8 g/dL    Comment:               **Please note reference interval change**   Bilirubin Total 0.4 0.0 - 1.2 mg/dL   Bilirubin, Direct 0.860.12 0.00 - 0.40 mg/dL   Alkaline Phosphatase 78 39 - 117 IU/L   AST 16 0 - 40 IU/L   ALT 13 0 - 44 IU/L  Lipid panel     Status: None   Collection Time: 08/16/18  8:20 AM  Result Value Ref Range   Cholesterol, Total 154 100 - 199 mg/dL   Triglycerides 55 0 - 149 mg/dL   HDL 44 >57>39 mg/dL   VLDL Cholesterol Cal 11 5 - 40 mg/dL   LDL Calculated 99 0 - 99 mg/dL   Chol/HDL Ratio 3.5 0.0 - 5.0 ratio    Comment:                                   T. Chol/HDL Ratio                                             Men  Women                               1/2 Avg.Risk  3.4    3.3                                   Avg.Risk  5.0    4.4  2X Avg.Risk  9.6    7.1                                3X Avg.Risk 23.4   11.0     Assessment/Plan:  DOE (dyspnea on exertion) We will plan a cardiopulmonary assessment and preoperative testing prior to his scheduled right carotid endarterectomy.  Carotid stenosis I have independently reviewed the patient's CT angiogram of his carotid arteries.  The official report is of 70 to 80% right carotid artery stenosis but it is clearly worse than that and is ulcerated.  His left carotid artery stenosis is fairly mild appears to be less than 50%. We have discussed the pathophysiology and the natural history of carotid artery disease.  I have discussed the reasons and rationale for treatment.  For high-grade stenosis even in an asymptomatic patient who is otherwise reasonably well, carotid endarterectomy is of clear benefit for stroke risk reduction.  As such, the patient should undergo right carotid endarterectomy for stroke risk reduction.  Preoperative testing will be scheduled in the near future at his convenience.  He should take an aspirin daily and we will usually use Plavix and a  statin agent postoperatively.  Essential hypertension blood pressure control important in reducing the progression of atherosclerotic disease. On appropriate oral medications.       Festus Barren 08/29/2018, 2:30 PM   This note was created with Dragon medical transcription system.  Any errors from dictation are unintentional.

## 2018-08-29 NOTE — Assessment & Plan Note (Signed)
blood pressure control important in reducing the progression of atherosclerotic disease. On appropriate oral medications.  

## 2018-08-29 NOTE — Assessment & Plan Note (Signed)
We will plan a cardiopulmonary assessment and preoperative testing prior to his scheduled right carotid endarterectomy.

## 2018-08-29 NOTE — Assessment & Plan Note (Signed)
I have independently reviewed the patient's CT angiogram of his carotid arteries.  The official report is of 70 to 80% right carotid artery stenosis but it is clearly worse than that and is ulcerated.  His left carotid artery stenosis is fairly mild appears to be less than 50%. We have discussed the pathophysiology and the natural history of carotid artery disease.  I have discussed the reasons and rationale for treatment.  For high-grade stenosis even in an asymptomatic patient who is otherwise reasonably well, carotid endarterectomy is of clear benefit for stroke risk reduction.  As such, the patient should undergo right carotid endarterectomy for stroke risk reduction.  Preoperative testing will be scheduled in the near future at his convenience.  He should take an aspirin daily and we will usually use Plavix and a statin agent postoperatively.

## 2018-09-11 DIAGNOSIS — R030 Elevated blood-pressure reading, without diagnosis of hypertension: Secondary | ICD-10-CM | POA: Diagnosis not present

## 2018-09-11 DIAGNOSIS — J399 Disease of upper respiratory tract, unspecified: Secondary | ICD-10-CM | POA: Diagnosis not present

## 2018-09-11 DIAGNOSIS — Z6827 Body mass index (BMI) 27.0-27.9, adult: Secondary | ICD-10-CM | POA: Diagnosis not present

## 2018-09-11 DIAGNOSIS — J309 Allergic rhinitis, unspecified: Secondary | ICD-10-CM | POA: Diagnosis not present

## 2018-09-11 DIAGNOSIS — E663 Overweight: Secondary | ICD-10-CM | POA: Diagnosis not present

## 2018-09-11 DIAGNOSIS — I6521 Occlusion and stenosis of right carotid artery: Secondary | ICD-10-CM | POA: Diagnosis not present

## 2018-09-11 DIAGNOSIS — I493 Ventricular premature depolarization: Secondary | ICD-10-CM | POA: Diagnosis not present

## 2018-09-15 ENCOUNTER — Encounter (INDEPENDENT_AMBULATORY_CARE_PROVIDER_SITE_OTHER): Payer: Self-pay

## 2018-09-20 ENCOUNTER — Other Ambulatory Visit (INDEPENDENT_AMBULATORY_CARE_PROVIDER_SITE_OTHER): Payer: Self-pay | Admitting: Nurse Practitioner

## 2018-09-21 ENCOUNTER — Other Ambulatory Visit: Payer: Self-pay

## 2018-09-21 ENCOUNTER — Encounter
Admission: RE | Admit: 2018-09-21 | Discharge: 2018-09-21 | Disposition: A | Discharge status: Home / Self Care | Payer: Medicare HMO | Source: Ambulatory Visit | Attending: Vascular Surgery | Admitting: Vascular Surgery

## 2018-09-21 DIAGNOSIS — Z01818 Encounter for other preprocedural examination: Secondary | ICD-10-CM | POA: Insufficient documentation

## 2018-09-21 DIAGNOSIS — Z01810 Encounter for preprocedural cardiovascular examination: Secondary | ICD-10-CM | POA: Diagnosis not present

## 2018-09-21 HISTORY — DX: Personal history of urinary calculi: Z87.442

## 2018-09-21 LAB — CBC WITH DIFFERENTIAL/PLATELET
Abs Immature Granulocytes: 0.03 10*3/uL (ref 0.00–0.07)
Basophils Absolute: 0 10*3/uL (ref 0.0–0.1)
Basophils Relative: 1 %
EOS PCT: 2 %
Eosinophils Absolute: 0.2 10*3/uL (ref 0.0–0.5)
HEMATOCRIT: 43.6 % (ref 39.0–52.0)
Hemoglobin: 14.3 g/dL (ref 13.0–17.0)
Immature Granulocytes: 0 %
LYMPHS ABS: 2 10*3/uL (ref 0.7–4.0)
Lymphocytes Relative: 25 %
MCH: 30.6 pg (ref 26.0–34.0)
MCHC: 32.8 g/dL (ref 30.0–36.0)
MCV: 93.4 fL (ref 80.0–100.0)
Monocytes Absolute: 0.5 10*3/uL (ref 0.1–1.0)
Monocytes Relative: 6 %
Neutro Abs: 5.4 10*3/uL (ref 1.7–7.7)
Neutrophils Relative %: 66 %
Platelets: 208 10*3/uL (ref 150–400)
RBC: 4.67 MIL/uL (ref 4.22–5.81)
RDW: 13.3 % (ref 11.5–15.5)
WBC: 8.2 10*3/uL (ref 4.0–10.5)
nRBC: 0 % (ref 0.0–0.2)

## 2018-09-21 LAB — BASIC METABOLIC PANEL
Anion gap: 4 — ABNORMAL LOW (ref 5–15)
BUN: 23 mg/dL (ref 8–23)
CO2: 28 mmol/L (ref 22–32)
CREATININE: 1.11 mg/dL (ref 0.61–1.24)
Calcium: 10.6 mg/dL — ABNORMAL HIGH (ref 8.9–10.3)
Chloride: 109 mmol/L (ref 98–111)
GFR calc Af Amer: >60 mL/min (ref >60)
GFR calc non Af Amer: >60 mL/min (ref >60)
Glucose, Bld: 94 mg/dL (ref 70–99)
Potassium: 4.3 mmol/L (ref 3.5–5.1)
Sodium: 141 mmol/L (ref 135–145)

## 2018-09-21 LAB — SURGICAL PCR SCREEN
MRSA, PCR: NEGATIVE
Staphylococcus aureus: NEGATIVE

## 2018-09-21 LAB — TYPE AND SCREEN
ABO/RH(D): B POS
Antibody Screen: NEGATIVE

## 2018-09-21 LAB — PROTIME-INR
INR: 1.1 (ref 0.8–1.2)
Prothrombin Time: 13.8 seconds (ref 11.4–15.2)

## 2018-09-21 LAB — APTT: aPTT: 28 seconds (ref 24–36)

## 2018-09-21 NOTE — Pre-Procedure Instructions (Signed)
Result Notes for ECHOCARDIOGRAM COMPLETE   Notes recorded by Craige Cotta, RN on 03/29/2018 at 3:42 PM EDT Informed patient's wife of results; informed to call the office with any further questions or concerns.  ------  Notes recorded by RevankarAundra Dubin, MD on 03/28/2018 at 4:32 PM EDT The results of the study is unremarkable. Please inform patient. I will discuss in detail at next appointment. [facility] primary care/referring physician Garwin Brothers, MD 03/28/2018 4:32 PM      Study Result   Result status: Edited Result - FINAL                     *[facility]*                        [address]                         [place], [place] [address]                            [phone]  ------------------------------------------------------------------- Transthoracic Echocardiography  Patient:    Robert Roy, Robert Roy MR #:       [id number] Study Date: 03/28/2018 Gender:     M Age:        73 Height:     188 cm Weight:     92.7 kg BSA:        2.21 m^2 Pt. Status: Room:   ATTENDING    De Blanch R  ORDERING     Belva Crome, MD  REFERRING    Belva Crome, MD  SONOGRAPHER  Lanae Crumbly, RDCS  PERFORMING   [facility] [place]  cc:  ------------------------------------------------------------------- LV EF: 60% -   65%  ------------------------------------------------------------------- Indications:      PVCs 427.69.  ------------------------------------------------------------------- History:   PMH:  LBBB.  Dyspnea.  Risk factors:  Dyslipidemia.  ------------------------------------------------------------------- Study Conclusions  - Left ventricle: The cavity size was normal. Systolic function was   normal. The estimated ejection fraction was in the range of 60%   to 65%. Wall motion was normal; there were no regional wall   motion abnormalities. Doppler parameters are consistent with   abnormal left ventricular relaxation (grade 1  diastolic   dysfunction).  ------------------------------------------------------------------- Study data:  No prior study was available for comparison.  Study status:  Routine.  Procedure:  The patient reported no pain pre or post test. Transthoracic echocardiography. Image quality was adequate.  Study completion:  There were no complications. Transthoracic echocardiography.  M-mode, complete 2D, spectral Doppler, and color Doppler.  Birthdate:  Patient birthdate: 07-22-1945.  Age:  Patient is 73 yr old.  Sex:  Gender: male. BMI: 26.2 kg/m^2.  Blood pressure:     126/72  Patient status: Outpatient.  Study date:  Study date: 03/28/2018. Study time: 09:22 AM.  Location:  Echo laboratory.  -------------------------------------------------------------------  ------------------------------------------------------------------- Left ventricle:  The cavity size was normal. Systolic function was normal. The estimated ejection fraction was in the range of 60% to 65%. Wall motion was normal; there were no regional wall motion abnormalities. Doppler parameters are consistent with abnormal left ventricular relaxation (grade 1 diastolic dysfunction).  ------------------------------------------------------------------- Aortic valve:   Trileaflet; normal thickness leaflets. Mobility was not restricted.  Doppler:  Transvalvular velocity was within the normal range. There was no stenosis. There was no regurgitation.   ------------------------------------------------------------------- Aorta:  Aortic root: The aortic root was normal in size.  ------------------------------------------------------------------- Mitral valve:   Structurally normal valve.   Mobility was not restricted.  Doppler:  Transvalvular velocity was within the normal range. There was no evidence for stenosis. There was no regurgitation.  ------------------------------------------------------------------- Left atrium:   The atrium was normal in size.  ------------------------------------------------------------------- Right ventricle:  The cavity size was normal. Wall thickness was normal. Systolic function was normal.  ------------------------------------------------------------------- Pulmonic valve:    Doppler:  Transvalvular velocity was within the normal range. There was no evidence for stenosis.  ------------------------------------------------------------------- Tricuspid valve:   Structurally normal valve.    Doppler: Transvalvular velocity was within the normal range. There was no regurgitation.  ------------------------------------------------------------------- Pulmonary artery:   The main pulmonary artery was normal-sized. Systolic pressure was within the normal range.  ------------------------------------------------------------------- Right atrium:  The atrium was normal in size.  ------------------------------------------------------------------- Pericardium:  There was no pericardial effusion.  ------------------------------------------------------------------- Systemic veins: Inferior vena cava: The vessel was normal in size.  ------------------------------------------------------------------- Measurements   Left ventricle                         Value        Reference  LV ID, ED, PLAX chordal                50    mm     43 - 52  LV ID, ES, PLAX chordal        (L)     3.5   mm     23 - 38  LV fx shortening, PLAX chordal         93    %      >=29  LV PW thickness, ED                    12    mm     ----------  IVS/LV PW ratio, ED                    0.92         <=1.3    Ventricular septum                     Value        Reference  IVS thickness, ED                      11    mm     ----------    LVOT                                   Value        Reference  LVOT peak velocity, S                  82    cm/s   ----------  LVOT VTI, S                            19.6   cm     ----------    Aorta                                  Value        Reference  Aortic root ID                         36    mm     ----------    Left atrium                            Value        Reference  LA ID, S-I, ES                         6.4   mm     ----------  LA area, ES, A4C               (H)     24    cm^2   8.8 - 23.4  LA ID, S-I, A2C                        7     mm     ----------  LA volume, S                           27    ml     ----------  LA volume/bsa, S                       12.2  ml/m^2 ----------  LA volume/bsa, ES, A/L                 36    ml/m^2 ----------    Mitral valve                           Value        Reference  Mitral E-wave peak velocity            58    cm/s   ----------  Mitral A-wave peak velocity            93    cm/s   ----------  Mitral deceleration time       (H)     343   ms     150 - 230  Mitral E/A ratio, peak                 0.62         ----------    Systemic veins                         Value        Reference  Estimated CVP                          3     mm Hg  ----------  Legend: (L)  and  (H)  mark values outside specified reference range.  ------------------------------------------------------------------- Prepared and Electronically Authenticated by  Belva Crome, MD 2019-09-24T15:45:46                    *[facility]*                        [address]  [place], [place] [address]                            [phone]  ------------------------------------------------------------------- Transthoracic Echocardiography  (Report amended )  Patient:    Robert Roy, Robert Roy MR #:       [id number] Study Date: 03/28/2018 Gender:     M Age:        80 Height:     188 cm Weight:     92.7 kg BSA:        2.21 m^2 Pt. Status: Room:   ATTENDING    De Blanch R  ORDERING     Belva Crome, MD  REFERRING    Belva Crome, MD  SONOGRAPHER  Lanae Crumbly, RDCS   PERFORMING   [facility] [place]  cc:  ------------------------------------------------------------------- LV EF: 60% -   65%  ------------------------------------------------------------------- Indications:      PVCs 427.69.  ------------------------------------------------------------------- History:   PMH:  LBBB.  Dyspnea.  Risk factors:  Dyslipidemia.  ------------------------------------------------------------------- Study Conclusions  - Left ventricle: The cavity size was normal. Systolic function was   normal. The estimated ejection fraction was in the range of 60%   to 65%. Wall motion was normal; there were no regional wall   motion abnormalities. Doppler parameters are consistent with   abnormal left ventricular relaxation (grade 1 diastolic   dysfunction).  Impressions:  - 1. Left ventricular systolic function is preserved visually   estimated ejection fraction is 60 to 65%. Impaired left   ventricular relaxation.  ------------------------------------------------------------------- Study data:  No prior study was available for comparison.  Study status:  Routine.  Procedure:  The patient reported no pain pre or post test. Transthoracic echocardiography. Image quality was adequate.  Study completion:  There were no complications. Transthoracic echocardiography.  M-mode, complete 2D, spectral Doppler, and color Doppler.  Birthdate:  Patient birthdate: May 09, 1946.  Age:  Patient is 73 yr old.  Sex:  Gender: male. BMI: 26.2 kg/m^2.  Blood pressure:     126/72  Patient status: Outpatient.  Study date:  Study date: 03/28/2018. Study time: 09:22 AM.  Location:  Echo laboratory.  -------------------------------------------------------------------  ------------------------------------------------------------------- Left ventricle:  The cavity size was normal. Systolic function was normal. The estimated ejection fraction was in the range of 60% to 65%. Wall  motion was normal; there were no regional wall motion abnormalities. Doppler parameters are consistent with abnormal left ventricular relaxation (grade 1 diastolic dysfunction).  ------------------------------------------------------------------- Aortic valve:   Trileaflet; normal thickness leaflets. Mobility was not restricted.  Doppler:  Transvalvular velocity was within the normal range. There was no stenosis. There was no regurgitation.   ------------------------------------------------------------------- Aorta:  Aortic root: The aortic root was normal in size.  ------------------------------------------------------------------- Mitral valve:   Structurally normal valve.   Mobility was not restricted.  Doppler:  Transvalvular velocity was within the normal range. There was no evidence for stenosis. There was no regurgitation.  ------------------------------------------------------------------- Left atrium:  The atrium was normal in size.  ------------------------------------------------------------------- Right ventricle:  The cavity size was normal. Wall thickness was normal. Systolic function was normal.  ------------------------------------------------------------------- Pulmonic valve:    Doppler:  Transvalvular velocity was within the normal range. There was no evidence for stenosis.  ------------------------------------------------------------------- Tricuspid valve:   Structurally normal valve.    Doppler: Transvalvular velocity was within the normal range. There was no regurgitation.  ------------------------------------------------------------------- Pulmonary artery:   The main pulmonary artery was normal-sized. Systolic pressure was within the normal range.  -------------------------------------------------------------------  Right atrium:  The atrium was normal in size.  ------------------------------------------------------------------- Pericardium:   There was no pericardial effusion.  ------------------------------------------------------------------- Systemic veins: Inferior vena cava: The vessel was normal in size.  ------------------------------------------------------------------- Measurements   Left ventricle                         Value        Reference  LV ID, ED, PLAX chordal                50    mm     43 - 52  LV ID, ES, PLAX chordal        (L)     3.5   mm     23 - 38  LV fx shortening, PLAX chordal         93    %      >=29  LV PW thickness, ED                    12    mm     ----------  IVS/LV PW ratio, ED                    0.92         <=1.3    Ventricular septum                     Value        Reference  IVS thickness, ED                      11    mm     ----------    LVOT                                   Value        Reference  LVOT peak velocity, S                  82    cm/s   ----------  LVOT VTI, S                            19.6  cm     ----------    Aorta                                  Value        Reference  Aortic root ID                         36    mm     ----------    Left atrium                            Value        Reference  LA ID, S-I, ES                         6.4   mm     ----------  LA area, ES, A4C               (H)     24  cm^2   8.8 - 23.4  LA ID, S-I, A2C                        7     mm     ----------  LA volume, S                           27    ml     ----------  LA volume/bsa, S                       12.2  ml/m^2 ----------  LA volume/bsa, ES, A/L                 36    ml/m^2 ----------    Mitral valve                           Value        Reference  Mitral E-wave peak velocity            58    cm/s   ----------  Mitral A-wave peak velocity            93    cm/s   ----------  Mitral deceleration time       (H)     343   ms     150 - 230  Mitral E/A ratio, peak                 0.62         ----------    Systemic veins                         Value        Reference   Estimated CVP                          3     mm Hg  ----------  Legend: (L)  and  (H)  mark values outside specified reference range.  ------------------------------------------------------------------- Olene Floss, MD 2019-09-24T15:46:46  [facility] Images   Show images for ECHOCARDIOGRAM COMPLETE  MERGE Images   Show images for ECHOCARDIOGRAM COMPLETE  Performing Technologist/Nurse   Performing Technologist/Nurse: Dorothey Baseman, RVT, RDCS  Reason for Exam  Priority: Routine  Dx: PVC (premature ventricular contraction) [I49.3 (ICD-10-CM)]; Carotid bruit, unspecified laterality [R09.89 (ICD-10-CM)]; DOE (dyspnea on exertion) [R06.09 (ICD-10-CM)]  Comments:                        Surgical History   Surgical History   No past medical history on file.    Other Surgical History   Procedure Laterality Date Comment Source  KIDNEY STONE SURGERY    Provider    Implants    No active implants to display in this view.  Order-Level Documents:   There are no order-level documents.  Encounter-Level Documents:   There are no encounter-level documents.  Resulted by:   Signed Date/Time  Phone Pager  Garwin Brothers 03/28/2018 3:46 PM [phone]   External Result Report   External Result Report

## 2018-09-21 NOTE — Patient Instructions (Signed)
Your procedure is scheduled on: 09-28-18 THURSDAY Report to [facility] Day Surgery 2nd floor medical mall (Medical Mall Entrance-take elevator on left to 2nd floor.  Check in with surgery information desk.) To find out your arrival time please call [phone] between 1PM - 3PM on 09-27-18 WEDNESDAY  Remember: Instructions that are not followed completely may result in serious medical risk, up to and including death, or upon the discretion of your surgeon and anesthesiologist your surgery may need to be rescheduled.    _x___ 1. Do not eat food after midnight the night before your procedure. NO GUM OR CANDY AFTER MIDNIGHT. You may drink clear liquids up to 2 hours before you are scheduled to arrive at the hospital for your procedure.  Do not drink clear liquids within 2 hours of your scheduled arrival to the hospital.  Clear liquids include  --Water or Apple juice without pulp  --Clear carbohydrate beverage such as ClearFast or Gatorade  --Black Coffee or Clear Tea (No milk, no creamers, do not add anything to  the coffee or Tea   ____Ensure clear carbohydrate drink on the way to the hospital for bariatric patients  ____Ensure clear carbohydrate drink 3 hours before surgery for Dr Byrnett's patients if physician instructed.     __x__ 2. No Alcohol for 24 hours before or after surgery.   __x__3. No Smoking or e-cigarettes for 24 prior to surgery.  Do not use any chewable tobacco products for at least 6 hour prior to surgery   ____  4. Bring all medications with you on the day of surgery if instructed.    __x__ 5. Notify your doctor if there is any change in your medical condition     (cold, fever, infections).    x___6. On the morning of surgery brush your teeth with toothpaste and water.  You may rinse your mouth with mouth wash if you wish.  Do not swallow any toothpaste or mouthwash.   Do not wear jewelry, make-up, hairpins, clips or nail polish.  Do not wear lotions, powders, or  perfumes. You may wear deodorant.  Do not shave 48 hours prior to surgery. Men may shave face and neck.  Do not bring valuables to the hospital.    [facility] is not responsible for any belongings or valuables.               Contacts, dentures or bridgework may not be worn into surgery.  Leave your suitcase in the car. After surgery it may be brought to your room.  For patients admitted to the hospital, discharge time is determined by your treatment team.  _  Patients discharged the day of surgery will not be allowed to drive home.  You will need someone to drive you home and stay with you the night of your procedure.    Please read over the following fact sheets that you were given:   [facility]  ____ Take anti-hypertensive listed below, cardiac, seizure, asthma, anti-reflux and psychiatric medicines. These include:  1. NONE  2.  3.  4.  5.  6.  ____Fleets enema or Magnesium Citrate as directed.   _x___ Use CHG Soap or sage wipes as directed on instruction sheet   ____ Use inhalers on the day of surgery and bring to hospital day of surgery  ____ Stop Metformin and Janumet 2 days prior to surgery.    ____ Take 1/2 of usual insulin dose the night before surgery and none on   the morning surgery.   ___ Follow recommendations from Cardiologist, Pulmonologist or PCP regarding stopping Aspirin, Coumadin, Plavix ,Eliquis, Effient, or Pradaxa, and Pletal.  X____Stop Anti-inflammatories such as Advil, Aleve, Ibuprofen, Motrin, Naproxen, Naprosyn, Goodies powders or aspirin products NOW-OK to take Tylenol    ____ Stop supplements until after surgery.     ____ Bring C-Pap to the hospital.    

## 2018-09-27 MED ORDER — CEFAZOLIN SODIUM-DEXTROSE 2-4 GM/100ML-% IV SOLN
2.0000 g | INTRAVENOUS | Status: AC
  Administered 2018-09-28: 2 g via INTRAVENOUS

## 2018-09-28 ENCOUNTER — Inpatient Hospital Stay: Payer: Medicare HMO | Admitting: Anesthesiology

## 2018-09-28 ENCOUNTER — Encounter: Payer: Self-pay | Admitting: *Deleted

## 2018-09-28 ENCOUNTER — Inpatient Hospital Stay
Admission: RE | Admit: 2018-09-28 | Discharge: 2018-09-29 | DRG: 039 | Disposition: A | Discharge status: Home / Self Care | Payer: Medicare HMO | Attending: Vascular Surgery | Admitting: Vascular Surgery

## 2018-09-28 ENCOUNTER — Other Ambulatory Visit: Payer: Self-pay

## 2018-09-28 ENCOUNTER — Encounter
Admission: RE | Disposition: A | Discharge status: Home / Self Care | Payer: Self-pay | Source: Home / Self Care | Attending: Vascular Surgery

## 2018-09-28 DIAGNOSIS — I6521 Occlusion and stenosis of right carotid artery: Secondary | ICD-10-CM | POA: Diagnosis not present

## 2018-09-28 HISTORY — DX: Occlusion and stenosis of right carotid artery: I65.21

## 2018-09-28 HISTORY — PX: ENDARTERECTOMY: SHX5162

## 2018-09-28 LAB — ABO/RH: ABO/RH(D): B POS

## 2018-09-28 LAB — GLUCOSE, CAPILLARY: GLUCOSE-CAPILLARY: 103 mg/dL — AB (ref 70–99)

## 2018-09-28 SURGERY — ENDARTERECTOMY, CAROTID
Anesthesia: General | Laterality: Right

## 2018-09-28 MED ORDER — TAMSULOSIN HCL 0.4 MG PO CAPS
0.4000 mg | ORAL_CAPSULE | Freq: Every day | ORAL | Status: DC
  Administered 2018-09-28: 0.4 mg via ORAL
  Filled 2018-09-28: qty 1

## 2018-09-28 MED ORDER — LACTATED RINGERS IV SOLN
INTRAVENOUS | Status: DC
  Administered 2018-09-28: 11:00:00 via INTRAVENOUS

## 2018-09-28 MED ORDER — NITROGLYCERIN IN D5W 200-5 MCG/ML-% IV SOLN: 5.0000 ug/min | INTRAVENOUS | Status: DC

## 2018-09-28 MED ORDER — POTASSIUM CHLORIDE CRYS ER 20 MEQ PO TBCR: 20.0000 meq | EXTENDED_RELEASE_TABLET | Freq: Every day | ORAL | Status: DC | PRN

## 2018-09-28 MED ORDER — CHLORHEXIDINE GLUCONATE CLOTH 2 % EX PADS: 6.0000 | MEDICATED_PAD | Freq: Once | CUTANEOUS | Status: DC

## 2018-09-28 MED ORDER — SUCCINYLCHOLINE CHLORIDE 20 MG/ML IJ SOLN
INTRAMUSCULAR | Status: DC | PRN
  Administered 2018-09-28: 100 mg via INTRAVENOUS

## 2018-09-28 MED ORDER — ALUM & MAG HYDROXIDE-SIMETH 200-200-20 MG/5ML PO SUSP: 15.0000 mL | ORAL | Status: DC | PRN

## 2018-09-28 MED ORDER — ACETAMINOPHEN 325 MG PO TABS
325.0000 mg | ORAL_TABLET | ORAL | Status: DC | PRN
  Administered 2018-09-28: 650 mg via ORAL
  Filled 2018-09-28: qty 2

## 2018-09-28 MED ORDER — FENTANYL CITRATE (PF) 100 MCG/2ML IJ SOLN
INTRAMUSCULAR | Status: AC
  Administered 2018-09-28: 25 ug via INTRAVENOUS
  Filled 2018-09-28: qty 2

## 2018-09-28 MED ORDER — DEXAMETHASONE SODIUM PHOSPHATE 10 MG/ML IJ SOLN
INTRAMUSCULAR | Status: DC | PRN
  Administered 2018-09-28: 10 mg via INTRAVENOUS

## 2018-09-28 MED ORDER — SODIUM CHLORIDE 0.9 % IV SOLN: 500.0000 mL | Freq: Once | INTRAVENOUS | Status: DC | PRN

## 2018-09-28 MED ORDER — ATORVASTATIN CALCIUM 10 MG PO TABS
10.0000 mg | ORAL_TABLET | Freq: Every day | ORAL | Status: DC
  Administered 2018-09-28: 10 mg via ORAL
  Filled 2018-09-28: qty 1

## 2018-09-28 MED ORDER — HEPARIN SODIUM (PORCINE) 1000 UNIT/ML IJ SOLN
INTRAMUSCULAR | Status: DC | PRN
  Administered 2018-09-28: 6000 [IU] via INTRAVENOUS

## 2018-09-28 MED ORDER — PHENYLEPHRINE HCL 10 MG/ML IJ SOLN
INTRAMUSCULAR | Status: DC | PRN
  Administered 2018-09-28 (×2): 100 ug via INTRAVENOUS

## 2018-09-28 MED ORDER — DOCUSATE SODIUM 100 MG PO CAPS
100.0000 mg | ORAL_CAPSULE | Freq: Every day | ORAL | Status: DC
  Filled 2018-09-28: qty 1

## 2018-09-28 MED ORDER — ROCURONIUM BROMIDE 100 MG/10ML IV SOLN
INTRAVENOUS | Status: DC | PRN
  Administered 2018-09-28: 40 mg via INTRAVENOUS
  Administered 2018-09-28: 10 mg via INTRAVENOUS

## 2018-09-28 MED ORDER — HYDROMORPHONE HCL 1 MG/ML IJ SOLN
INTRAMUSCULAR | Status: AC
  Filled 2018-09-28: qty 1

## 2018-09-28 MED ORDER — SUGAMMADEX SODIUM 200 MG/2ML IV SOLN
INTRAVENOUS | Status: DC | PRN
  Administered 2018-09-28: 183.2 mg via INTRAVENOUS

## 2018-09-28 MED ORDER — ONDANSETRON HCL 4 MG/2ML IJ SOLN
INTRAMUSCULAR | Status: DC | PRN
  Administered 2018-09-28: 4 mg via INTRAVENOUS

## 2018-09-28 MED ORDER — LACTATED RINGERS IV SOLN
INTRAVENOUS | Status: DC
  Administered 2018-09-28 (×2): via INTRAVENOUS

## 2018-09-28 MED ORDER — FAMOTIDINE IN NACL 20-0.9 MG/50ML-% IV SOLN
20.0000 mg | Freq: Two times a day (BID) | INTRAVENOUS | Status: DC
  Administered 2018-09-28: 20 mg via INTRAVENOUS
  Filled 2018-09-28: qty 50

## 2018-09-28 MED ORDER — SODIUM CHLORIDE 0.9 % IV SOLN
INTRAVENOUS | Status: DC | PRN
  Administered 2018-09-28: 50 ug/min via INTRAVENOUS

## 2018-09-28 MED ORDER — FENTANYL CITRATE (PF) 100 MCG/2ML IJ SOLN
25.0000 ug | INTRAMUSCULAR | Status: AC | PRN
  Administered 2018-09-28 (×6): 25 ug via INTRAVENOUS

## 2018-09-28 MED ORDER — ONDANSETRON HCL 4 MG/2ML IJ SOLN: 4.0000 mg | Freq: Four times a day (QID) | INTRAMUSCULAR | Status: DC | PRN

## 2018-09-28 MED ORDER — ACETAMINOPHEN 325 MG RE SUPP
325.0000 mg | RECTAL | Status: DC | PRN
  Filled 2018-09-28: qty 2

## 2018-09-28 MED ORDER — ASPIRIN EC 81 MG PO TBEC
81.0000 mg | DELAYED_RELEASE_TABLET | Freq: Every day | ORAL | Status: DC
  Administered 2018-09-28 – 2018-09-29 (×2): 81 mg via ORAL
  Filled 2018-09-28 (×2): qty 1

## 2018-09-28 MED ORDER — METOPROLOL TARTRATE 5 MG/5ML IV SOLN: 2.0000 mg | INTRAVENOUS | Status: DC | PRN

## 2018-09-28 MED ORDER — ESMOLOL HCL-SODIUM CHLORIDE 2000 MG/100ML IV SOLN
25.0000 ug/kg/min | INTRAVENOUS | Status: DC
  Filled 2018-09-28: qty 100

## 2018-09-28 MED ORDER — CLOPIDOGREL BISULFATE 75 MG PO TABS
75.0000 mg | ORAL_TABLET | Freq: Every day | ORAL | Status: DC
  Administered 2018-09-29: 75 mg via ORAL
  Filled 2018-09-28: qty 1

## 2018-09-28 MED ORDER — CEFAZOLIN SODIUM-DEXTROSE 2-4 GM/100ML-% IV SOLN
2.0000 g | Freq: Three times a day (TID) | INTRAVENOUS | Status: AC
  Administered 2018-09-28 – 2018-09-29 (×2): 2 g via INTRAVENOUS
  Filled 2018-09-28 (×3): qty 100

## 2018-09-28 MED ORDER — LIDOCAINE HCL 1 % IJ SOLN
INTRAMUSCULAR | Status: DC | PRN
  Administered 2018-09-28: 10 mL

## 2018-09-28 MED ORDER — FENTANYL CITRATE (PF) 100 MCG/2ML IJ SOLN
50.0000 ug | Freq: Once | INTRAMUSCULAR | Status: AC
  Administered 2018-09-28: 50 ug via INTRAVENOUS

## 2018-09-28 MED ORDER — PHENOL 1.4 % MT LIQD
1.0000 | OROMUCOSAL | Status: DC | PRN
  Filled 2018-09-28: qty 177

## 2018-09-28 MED ORDER — DOPAMINE-DEXTROSE 3.2-5 MG/ML-% IV SOLN
6.0000 ug/kg/min | INTRAVENOUS | Status: DC
  Administered 2018-09-28: 6 ug/kg/min via INTRAVENOUS
  Filled 2018-09-28: qty 250

## 2018-09-28 MED ORDER — MIDAZOLAM HCL 2 MG/2ML IJ SOLN
INTRAMUSCULAR | Status: DC | PRN
  Administered 2018-09-28 (×2): 1 mg via INTRAVENOUS

## 2018-09-28 MED ORDER — HYDROMORPHONE HCL 1 MG/ML IJ SOLN: 0.2500 mg | INTRAMUSCULAR | Status: DC | PRN

## 2018-09-28 MED ORDER — FAMOTIDINE 20 MG PO TABS
ORAL_TABLET | ORAL | Status: AC
  Administered 2018-09-28: 20 mg via ORAL
  Filled 2018-09-28: qty 1

## 2018-09-28 MED ORDER — FENTANYL CITRATE (PF) 100 MCG/2ML IJ SOLN
INTRAMUSCULAR | Status: DC | PRN
  Administered 2018-09-28: 50 ug via INTRAVENOUS

## 2018-09-28 MED ORDER — HYDRALAZINE HCL 20 MG/ML IJ SOLN
5.0000 mg | INTRAMUSCULAR | Status: AC | PRN
  Administered 2018-09-28 (×2): 10 mg via INTRAVENOUS

## 2018-09-28 MED ORDER — MAGNESIUM SULFATE 2 GM/50ML IV SOLN: 2.0000 g | Freq: Every day | INTRAVENOUS | Status: DC | PRN

## 2018-09-28 MED ORDER — HEPARIN SODIUM (PORCINE) 10000 UNIT/ML IJ SOLN
INTRAMUSCULAR | Status: AC
  Filled 2018-09-28: qty 1

## 2018-09-28 MED ORDER — GLYCOPYRROLATE 0.2 MG/ML IJ SOLN
INTRAMUSCULAR | Status: DC | PRN
  Administered 2018-09-28: 0.2 mg via INTRAVENOUS

## 2018-09-28 MED ORDER — LIDOCAINE HCL (CARDIAC) PF 100 MG/5ML IV SOSY
PREFILLED_SYRINGE | INTRAVENOUS | Status: DC | PRN
  Administered 2018-09-28: 50 mg via INTRAVENOUS

## 2018-09-28 MED ORDER — ONDANSETRON HCL 4 MG/2ML IJ SOLN: 4.0000 mg | Freq: Once | INTRAMUSCULAR | Status: DC | PRN

## 2018-09-28 MED ORDER — SODIUM CHLORIDE 0.9 % IV SOLN
INTRAVENOUS | Status: DC
  Administered 2018-09-28: 16:00:00 via INTRAVENOUS

## 2018-09-28 MED ORDER — OXYCODONE-ACETAMINOPHEN 5-325 MG PO TABS: 1.0000 | ORAL_TABLET | ORAL | Status: DC | PRN

## 2018-09-28 MED ORDER — LABETALOL HCL 5 MG/ML IV SOLN: 10.0000 mg | INTRAVENOUS | Status: DC | PRN

## 2018-09-28 MED ORDER — GUAIFENESIN-DM 100-10 MG/5ML PO SYRP: 15.0000 mL | ORAL_SOLUTION | ORAL | Status: DC | PRN

## 2018-09-28 MED ORDER — FAMOTIDINE 20 MG PO TABS
20.0000 mg | ORAL_TABLET | Freq: Once | ORAL | Status: AC
  Administered 2018-09-28: 20 mg via ORAL

## 2018-09-28 MED ORDER — CEFAZOLIN SODIUM-DEXTROSE 2-4 GM/100ML-% IV SOLN
INTRAVENOUS | Status: AC
  Filled 2018-09-28: qty 100

## 2018-09-28 MED ORDER — PROPOFOL 10 MG/ML IV BOLUS
INTRAVENOUS | Status: DC | PRN
  Administered 2018-09-28: 130 mg via INTRAVENOUS

## 2018-09-28 MED ORDER — EVICEL 2 ML EX KIT
PACK | CUTANEOUS | Status: DC | PRN
  Administered 2018-09-28: 2 mL

## 2018-09-28 MED ORDER — MORPHINE SULFATE (PF) 4 MG/ML IV SOLN: 2.0000 mg | INTRAVENOUS | Status: DC | PRN

## 2018-09-28 SURGICAL SUPPLY — 66 items
"PENCIL ELECTRO HAND CTR " (MISCELLANEOUS) IMPLANT
BAG DECANTER FOR FLEXI CONT (MISCELLANEOUS) ×3 IMPLANT
BLADE SURG 15 STRL LF DISP TIS (BLADE) ×1 IMPLANT
BLADE SURG 15 STRL SS (BLADE) ×2
BLADE SURG SZ11 CARB STEEL (BLADE) ×3 IMPLANT
BOOT SUTURE AID YELLOW STND (SUTURE) ×3 IMPLANT
BRUSH SCRUB EZ  4% CHG (MISCELLANEOUS) ×2
BRUSH SCRUB EZ 4% CHG (MISCELLANEOUS) ×1 IMPLANT
CANISTER SUCT 1200ML W/VALVE (MISCELLANEOUS) ×3 IMPLANT
COVER WAND RF STERILE (DRAPES) ×3 IMPLANT
DERMABOND ADVANCED (GAUZE/BANDAGES/DRESSINGS) ×2
DERMABOND ADVANCED .7 DNX12 (GAUZE/BANDAGES/DRESSINGS) ×1 IMPLANT
DRAPE INCISE IOBAN 66X45 STRL (DRAPES) ×3 IMPLANT
DRAPE LAPAROTOMY 77X122 PED (DRAPES) ×3 IMPLANT
DRAPE SHEET LG 3/4 BI-LAMINATE (DRAPES) ×3 IMPLANT
DRSG TEGADERM 4X4.75 (GAUZE/BANDAGES/DRESSINGS) IMPLANT
DRSG TELFA 3X8 NADH (GAUZE/BANDAGES/DRESSINGS) IMPLANT
DURAPREP 26ML APPLICATOR (WOUND CARE) ×3 IMPLANT
ELECT CAUTERY BLADE 6.4 (BLADE) ×3 IMPLANT
ELECT REM PT RETURN 9FT ADLT (ELECTROSURGICAL) ×3
ELECTRODE REM PT RTRN 9FT ADLT (ELECTROSURGICAL) ×1 IMPLANT
GLOVE BIO SURGEON STRL SZ7 (GLOVE) ×9 IMPLANT
GLOVE INDICATOR 7.5 STRL GRN (GLOVE) ×3 IMPLANT
GOWN STRL REUS W/ TWL LRG LVL3 (GOWN DISPOSABLE) ×2 IMPLANT
GOWN STRL REUS W/ TWL XL LVL3 (GOWN DISPOSABLE) ×2 IMPLANT
GOWN STRL REUS W/TWL LRG LVL3 (GOWN DISPOSABLE) ×4
GOWN STRL REUS W/TWL XL LVL3 (GOWN DISPOSABLE) ×4
HEMOSTAT SURGICEL 2X3 (HEMOSTASIS) ×3 IMPLANT
IV NS 250ML (IV SOLUTION) ×2
IV NS 250ML BAXH (IV SOLUTION) ×1 IMPLANT
KIT TURNOVER KIT A (KITS) ×3 IMPLANT
LABEL OR SOLS (LABEL) ×3 IMPLANT
LOOP RED MAXI  1X406MM (MISCELLANEOUS) ×4
LOOP VESSEL MAXI 1X406 RED (MISCELLANEOUS) ×2 IMPLANT
LOOP VESSEL MINI 0.8X406 BLUE (MISCELLANEOUS) ×1 IMPLANT
LOOPS BLUE MINI 0.8X406MM (MISCELLANEOUS) ×2
NDL FILTER BLUNT 18X1 1/2 (NEEDLE) ×1 IMPLANT
NDL HYPO 25X1 1.5 SAFETY (NEEDLE) ×1 IMPLANT
NEEDLE FILTER BLUNT 18X 1/2SAF (NEEDLE) ×2
NEEDLE FILTER BLUNT 18X1 1/2 (NEEDLE) ×1 IMPLANT
NEEDLE HYPO 25X1 1.5 SAFETY (NEEDLE) ×3 IMPLANT
NS IRRIG 1000ML POUR BTL (IV SOLUTION) ×3 IMPLANT
PACK BASIN MAJOR ARMC (MISCELLANEOUS) ×3 IMPLANT
PAD DRESSING TELFA 3X8 NADH (GAUZE/BANDAGES/DRESSINGS) IMPLANT
PATCH CAROTID ECM VASC 1X10 (Prosthesis & Implant Heart) ×3 IMPLANT
PENCIL ELECTRO HAND CTR (MISCELLANEOUS) IMPLANT
SHUNT W TPORT 9FR PRUITT F3 (SHUNT) ×3 IMPLANT
SUT MNCRL 4-0 (SUTURE) ×2
SUT MNCRL 4-0 27XMFL (SUTURE) ×1
SUT PROLENE 6 0 BV (SUTURE) ×12 IMPLANT
SUT PROLENE 7 0 BV 1 (SUTURE) ×6 IMPLANT
SUT SILK 2 0 (SUTURE) ×2
SUT SILK 2-0 18XBRD TIE 12 (SUTURE) ×1 IMPLANT
SUT SILK 3 0 (SUTURE) ×2
SUT SILK 3-0 18XBRD TIE 12 (SUTURE) ×1 IMPLANT
SUT SILK 4 0 (SUTURE) ×2
SUT SILK 4-0 18XBRD TIE 12 (SUTURE) ×1 IMPLANT
SUT VIC AB 3-0 SH 27 (SUTURE) ×4
SUT VIC AB 3-0 SH 27X BRD (SUTURE) ×2 IMPLANT
SUTURE MNCRL 4-0 27XMF (SUTURE) ×1 IMPLANT
SYR 10ML LL (SYRINGE) ×6 IMPLANT
SYR 20CC LL (SYRINGE) ×3 IMPLANT
TOWEL OR 17X26 4PK STRL BLUE (TOWEL DISPOSABLE) ×3 IMPLANT
TRAY FOLEY MTR SLVR 16FR STAT (SET/KITS/TRAYS/PACK) ×3 IMPLANT
TUBING CONNECTING 10 (TUBING) IMPLANT
TUBING CONNECTING 10' (TUBING)

## 2018-09-28 NOTE — Anesthesia Procedure Notes (Signed)

## 2018-09-28 NOTE — Op Note (Signed)
[facility]   OPERATIVE NOTE  PROCEDURE:   1.  Right carotid endarterectomy with CorMatrix arterial patch reconstruction  PRE-OPERATIVE DIAGNOSIS: 1.  High grade ulcerated right carotid stenosis   POST-OPERATIVE DIAGNOSIS: same as above   SURGEON: Festus Barren, MD  ASSISTANT(S): Raul Del, PA-C  ANESTHESIA: general  ESTIMATED BLOOD LOSS: 30 cc  FINDING(S): 1.  right carotid plaque.  SPECIMEN(S):  Carotid plaque (sent to Pathology)  INDICATIONS:   Robert Roy is a 73 y.o. male who presents with right carotid stenosis with severe ulceration and >70%.  I discussed with the patient the risks, benefits, and alternatives to carotid endarterectomy.  I discussed the differences between carotid stenting and carotid endarterectomy. I discussed the procedural details of carotid endarterectomy with the patient.  The patient is aware that the risks of carotid endarterectomy include but are not limited to: bleeding, infection, stroke, myocardial infarction, death, cranial nerve injuries both temporary and permanent, neck hematoma, possible airway compromise, labile blood pressure post-operatively, cerebral hyperperfusion syndrome, and possible need for additional interventions in the future. The patient is aware of the risks and agrees to proceed forward with the procedure. An assistant was present during the procedure to help facilitate the exposure and expedite the procedure.  DESCRIPTION: After full informed written consent was obtained from the patient, the patient was brought back to the operating room and placed supine upon the operating table.  Prior to induction, the patient received IV antibiotics.  After obtaining adequate anesthesia, the patient was placed into a modified beach chair position with a shoulder roll in place and the patient's neck slightly hyperextended and rotated away from the surgical site.  The patient was prepped in the standard fashion for a  carotid endarterectomy. The assistant provided retraction and mobilization to help facilitate exposure and expedite the procedure throughout the entire procedure.  This included following suture, using retractors, and optimizing lighting. I made an incision anterior to the sternocleidomastoid muscle and dissected down through the subcutaneous tissue.  The platysmas was opened with electrocautery.  Then I dissected down to the internal jugular vein and facial vein.  The facial vein is ligated and divided between 2-0 silk ties.  This was dissected posteriorly until I obtained visualization of the common carotid artery.  This was dissected out and then a vessel loop was placed around the common carotid artery.  I then dissected in a periadventitial fashion along the common carotid artery up to the bifurcation.  I then identified the external carotid artery and the superior thyroid artery.  I placed a vessel loop around the superior thyroid artery, and I also dissected out the external carotid artery and placed a vessel loop around it. In the process of this dissection, the hypoglossal nerve was identified and protected from harm.  I then dissected out the internal carotid artery until I identified an area in the internal carotid artery clearly above the stenosis.  I dissected slightly distal to this area, and placed a vessel loop around the artery.  At this point, we gave the patient 6000 units of intravenous heparin.  After this was allowed to circulate for several minutes, I pulled up control on the vessel loops to clamp the internal carotid artery, external carotid artery, superior thyroid artery, and then the common carotid artery.  I then made an arteriotomy in the common carotid artery with a 11 blade, and extended the arteriotomy with a Potts scissor down into the common carotid artery, then I  carried the arteriotomy through the bifurcation into the internal carotid artery until I reached an area that was not  diseased.  At this point, I took the [place] shunt that previously been prepared and I inserted it into the internal carotid artery first, and then into the common carotid artery taking care to flush and de-air prior to release of control. At this point, I started the endarterectomy in the common carotid artery with a [place] elevator and carried this dissection down into the common carotid artery circumferentially.  Then I transected the plaque at a segment where it was adherent and transected the plaque with Potts scissors.  I then carried this dissection up into the external carotid artery.  The plaque was extracted by unclamping the external carotid artery and performing an eversion endarterectomy.  The dissection was then carried into the internal carotid artery where a nice feathered end point was created with gentle traction.  I passed the plaque off the field as a specimen. At this point I removed all loose flecks and remaining disease possible.  At this point, I was satisfied that the minimal remaining disease was densely adherent to the wall and wall integrity was intact. The distal endpoint was tacked down with two 7-0 Prolene sutures.  I then fashioned a CorMatrix arterial patch for the artery and sewed it in place with two running stitch of 6-0 Prolene.  I started at the distal endpoint and ran one half the length of the arteriotomy.  I then cut and beveled the patch to an appropriate length to match the arteriotomy.  I started the second 6-0 Prolene at the proximal end point.  The medial suture line was completed and the lateral suture line was run approximately one quarter the length of the arteriotomy.  Prior to completing this patch angioplasty, I removed the shunt first from the internal carotid artery, from which there was excellent backbleeding, and clamped it.  Then I removed the shunt from the common carotid artery, from which there was excellent antegrade bleeding, and then clamped it.   At this point, I allowed the external carotid artery to backbleed, which was excellent.  Then I instilled heparinized saline in this patched artery and then completed the patch angioplasty in the usual fashion.  First, I released the clamp on the external carotid artery, then I released it on the common carotid artery.  After waiting a few seconds, I then released it on the internal carotid artery. Several minutes of pressure were held and 6-0 Prolene patch sutures were used as need for hemostasis.  At this point, I placed Surgicel and Evicel topical hemostatic agents.  There was no more active bleeding in the surgical site.  The sternocleidomastoid space was closed with three interrupted 3-0 Vicryl sutures. I then reapproximated the platysma muscle with a running stitch of 3-0 Vicryl.  The skin was then closed with a running subcuticular 4-0 Monocryl.  The skin was then cleaned, dried and Dermabond was used to reinforce the skin closure.  The patient awakened and was taken to the recovery room in stable condition, following commands and moving all four extremities without any apparent deficits.    COMPLICATIONS: none  CONDITION: stable  Festus Barren  09/28/2018, 1:37 PM    This note was created with Dragon Medical transcription system. Any errors in dictation are purely unintentional.

## 2018-09-28 NOTE — Transfer of Care (Signed)
Immediate Anesthesia Transfer of Care Note  Patient: Robert Roy  Procedure(s) Performed: ENDARTERECTOMY CAROTID (Right )  Patient Location: PACU  Anesthesia Type:General  Level of Consciousness: awake and sedated  Airway & Oxygen Therapy: Patient Spontanous Breathing and Patient connected to face mask oxygen  Post-op Assessment: Report given to RN and Post -op Vital signs reviewed and stable  Post vital signs: Reviewed and stable  Last Vitals:  Vitals Value Taken Time  BP    Temp    Pulse 60 09/28/2018  2:06 PM  Resp    SpO2 99 % 09/28/2018  2:06 PM  Vitals shown include unvalidated device data.  Last Pain:  Vitals:   09/28/18 1028  TempSrc: Temporal  PainSc: 0-No pain         Complications: No apparent anesthesia complications

## 2018-09-28 NOTE — Progress Notes (Signed)
Pt called writing RN to room and noted "this arm is getting pretty tight."  I observed his left forarm edematous medial to IV insertion site.  Iv discontinued, infusion moved to right arm.  Left arm elevated on two pillows.  Pt denies pain, no redness to area, just edema.

## 2018-09-28 NOTE — Progress Notes (Signed)
Dr. Gilda Crease notified of patients systolic blood pressure in the 90s, and continued bradycardia. New verbal orders given. Will continue to monitor.

## 2018-09-28 NOTE — H&P (Signed)
[facility] History & Physical Update  The patient was interviewed and re-examined.  The patient's previous History and Physical has been reviewed and is unchanged.  There is no change in the plan of care. We plan to proceed with the scheduled procedure.  Festus Barren, MD  09/28/2018, 11:07 AM

## 2018-09-28 NOTE — Anesthesia Preprocedure Evaluation (Signed)
Anesthesia Evaluation  Patient identified by MRN, date of birth, ID band Patient awake    Reviewed: Allergy & Precautions, H&P , NPO status , Patient's Chart, lab work & pertinent test results, reviewed documented beta blocker date and time   History of Anesthesia Complications Negative for: history of anesthetic complications  Airway Mallampati: II  TM Distance: >3 FB Neck ROM: full    Dental  (+) Dental Advidsory Given, Caps, Missing, Teeth Intact   Pulmonary neg pulmonary ROS,           Cardiovascular Exercise Tolerance: Good hypertension, (-) angina(-) CAD, (-) Past MI, (-) Cardiac Stents and (-) CABG (-) dysrhythmias (-) Valvular Problems/Murmurs     Neuro/Psych negative neurological ROS  negative psych ROS   GI/Hepatic negative GI ROS, Neg liver ROS,   Endo/Other  negative endocrine ROS  Renal/GU Renal disease (kidney stones)  negative genitourinary   Musculoskeletal   Abdominal   Peds  Hematology negative hematology ROS (+)   Anesthesia Other Findings Past Medical History: No date: BMI 28.0-28.9,adult No date: History of kidney stones     Comment:  h/o   Reproductive/Obstetrics negative OB ROS                             Anesthesia Physical Anesthesia Plan  ASA: II  Anesthesia Plan: General   Post-op Pain Management:    Induction: Intravenous  PONV Risk Score and Plan: 2 and Ondansetron, Dexamethasone and Treatment may vary due to age or medical condition  Airway Management Planned: Oral ETT  Additional Equipment:   Intra-op Plan:   Post-operative Plan: Extubation in OR  Informed Consent: I have reviewed the patients History and Physical, chart, labs and discussed the procedure including the risks, benefits and alternatives for the proposed anesthesia with the patient or authorized representative who has indicated his/her understanding and acceptance.     Dental  Advisory Given  Plan Discussed with: Anesthesiologist, CRNA and Surgeon  Anesthesia Plan Comments:         Anesthesia Quick Evaluation

## 2018-09-28 NOTE — Anesthesia Post-op Follow-up Note (Signed)
Anesthesia QCDR form completed.        

## 2018-09-28 NOTE — Plan of Care (Signed)
Pt oriented to ICU, on room air, resting comfortably post op w/ APAP for soreness, endartorectomy site clean and dry, no other skin issues noted, pt able to turn self in bed. MD Dew notified of asymptomatic sinus bradycardia in 40s, he directs to start dopamine at 3-5 mcgs if heart rate declines into 30s

## 2018-09-29 ENCOUNTER — Encounter: Payer: Self-pay | Admitting: Vascular Surgery

## 2018-09-29 LAB — BASIC METABOLIC PANEL
Anion gap: 6 (ref 5–15)
BUN: 25 mg/dL — ABNORMAL HIGH (ref 8–23)
CO2: 23 mmol/L (ref 22–32)
Calcium: 9.8 mg/dL (ref 8.9–10.3)
Chloride: 110 mmol/L (ref 98–111)
Creatinine, Ser: 1.23 mg/dL (ref 0.61–1.24)
GFR calc Af Amer: >60 mL/min (ref >60)
GFR calc non Af Amer: 59 mL/min — ABNORMAL LOW (ref >60)
GLUCOSE: 107 mg/dL — AB (ref 70–99)
Potassium: 4.6 mmol/L (ref 3.5–5.1)
Sodium: 139 mmol/L (ref 135–145)

## 2018-09-29 LAB — CBC
HCT: 39.2 % (ref 39.0–52.0)
Hemoglobin: 12.9 g/dL — ABNORMAL LOW (ref 13.0–17.0)
MCH: 30.9 pg (ref 26.0–34.0)
MCHC: 32.9 g/dL (ref 30.0–36.0)
MCV: 93.8 fL (ref 80.0–100.0)
Platelets: 178 10*3/uL (ref 150–400)
RBC: 4.18 MIL/uL — ABNORMAL LOW (ref 4.22–5.81)
RDW: 13.5 % (ref 11.5–15.5)
WBC: 11.4 10*3/uL — ABNORMAL HIGH (ref 4.0–10.5)
nRBC: 0 % (ref 0.0–0.2)

## 2018-09-29 MED ORDER — ATORVASTATIN CALCIUM 10 MG PO TABS: 10.0000 mg | ORAL_TABLET | Freq: Every day | ORAL | 3 refills | Status: DC

## 2018-09-29 MED ORDER — FAMOTIDINE 20 MG PO TABS
20.0000 mg | ORAL_TABLET | Freq: Two times a day (BID) | ORAL | Status: DC
  Administered 2018-09-29: 20 mg via ORAL
  Filled 2018-09-29: qty 1

## 2018-09-29 MED ORDER — ASPIRIN 81 MG PO TBEC: 81.0000 mg | DELAYED_RELEASE_TABLET | Freq: Every day | ORAL | 3 refills | Status: DC

## 2018-09-29 MED ORDER — ACETAMINOPHEN 325 MG PO TABS: 325.0000 mg | ORAL_TABLET | Freq: Four times a day (QID) | ORAL | Status: DC | PRN

## 2018-09-29 MED ORDER — CLOPIDOGREL BISULFATE 75 MG PO TABS: 75.0000 mg | ORAL_TABLET | Freq: Every day | ORAL | 3 refills | Status: DC

## 2018-09-29 NOTE — Discharge Instructions (Signed)
You may shower as of Sunday.  Keep incision clean and dry. Dermabond to fall off on its own.  No driving for two weeks. No heavy lifting (greater than 10lbs) / extrenuos activity for three weeks.

## 2018-09-29 NOTE — Discharge Summary (Signed)
[facility]    Discharge Summary  Patient ID:  Robert Roy MRN: [id number] DOB/AGE: [birth year] 73 y.o.  Admit date: 09/28/2018 Discharge date: 09/29/2018 Date of Surgery: 09/28/2018 Surgeon: Surgeon(s): Wyn Quaker Marlow Baars, MD  Admission Diagnosis: CAROTID ARTERY STENOSIS  Discharge Diagnoses:  CAROTID ARTERY STENOSIS  Secondary Diagnoses: Past Medical History:  Diagnosis Date  . BMI 28.0-28.9,adult   . History of kidney stones    h/o   Procedure(s): ENDARTERECTOMY CAROTID (RIGHT)  Discharged Condition: good  HPI:  The patient is a 73 year old male who presented with right carotid stenosis with severe ulceration and >70%.  September 28, 2018, the patient underwent a: 1.  Right carotid endarterectomy with CorMatrix arterial patch reconstruction He tolerated the procedure well was transferred to the recovery room then the ICU for observation overnight.  The patient's night of surgery was unremarkable.  Postop day #1/day of discharge, the patient's Foley was removed /was urinating without issue, his diet was advanced without complication, his pain was controlled to the use of p.o. pain medication and he was ambulating independently.  Upon discharge, the patient was afebrile with stable vital signs.  The patient's physical exam was as expected/unremarkable.  Hospital Course:  Robert Roy is a 73 y.o. male is S/P Right:  Procedure(s): ENDARTERECTOMY CAROTID (RIGHT)  Extubated: POD # 0  Physical exam:  A&Ox3, NAD Face: Symmetrical, Tongue is midline Neck: Incision is clean, dry and intact. Dermabond intact. No swelling. No drainage CV: RRR Pulm: CTA Bilaterally Abdomen: Soft, non-tender, non-distended, (+) bowel sounds Extremity: Warm, non-tender, minimal edema Neurological: No deficients noted.  Post-op wounds clean, dry, intact or healing well  Pt. Ambulating, voiding and taking PO diet without difficulty.  Pt pain controlled with PO pain  meds.  Labs as below  Complications: None  Consults: None  Significant Diagnostic Studies: CBC Lab Results  Component Value Date   WBC 11.4 (H) 09/29/2018   HGB 12.9 (L) 09/29/2018   HCT 39.2 09/29/2018   MCV 93.8 09/29/2018   PLT 178 09/29/2018   BMET    Component Value Date/Time   NA 139 09/29/2018 0543   NA 142 08/16/2018 0820   K 4.6 09/29/2018 0543   CL 110 09/29/2018 0543   CO2 23 09/29/2018 0543   GLUCOSE 107 (H) 09/29/2018 0543   BUN 25 (H) 09/29/2018 0543   BUN 15 08/16/2018 0820   CREATININE 1.23 09/29/2018 0543   CALCIUM 9.8 09/29/2018 0543   GFRNONAA 59 (L) 09/29/2018 0543   GFRAA >60 09/29/2018 0543   COAG Lab Results  Component Value Date   INR 1.1 09/21/2018   Disposition:  Discharge to :Home  Allergies as of 09/29/2018   No Known Allergies     Medication List    TAKE these medications   acetaminophen 325 MG tablet Commonly known as:  TYLENOL Take 1-2 tablets (325-650 mg total) by mouth every 6 (six) hours as needed for mild pain or moderate pain (or temp >/= 101 F).   aspirin 81 MG EC tablet Take 1 tablet (81 mg total) by mouth daily. Start taking on:  September 30, 2018   atorvastatin 10 MG tablet Commonly known as:  LIPITOR Take 1 tablet (10 mg total) by mouth at bedtime.   clopidogrel 75 MG tablet Commonly known as:  PLAVIX Take 1 tablet (75 mg total) by mouth daily with breakfast. Start taking on:  September 30, 2018   DOXYCYCLINE HYCLATE PO Take 100 mg by mouth 2 (two)  times daily.   tamsulosin 0.4 MG Caps capsule Commonly known as:  FLOMAX Take 0.4 mg by mouth daily after supper.      Verbal and written Discharge instructions given to the patient. Wound care per Discharge AVS Follow-up Information    Annice Needy, MD Follow up on 12/30/2018.   Specialties:  Vascular Surgery, Radiology, Interventional Cardiology Why:  Can see Dew or midlevel after 12/31/18. Will need bilateral carotid duplex with visit. First post-op visit.   Contact information: [address] [place] [address] [phone]          Signed: Tonette Lederer, PA-C  09/29/2018, 11:26 AM

## 2018-09-29 NOTE — Anesthesia Postprocedure Evaluation (Signed)
Anesthesia Post Note  Patient: ZAIVEN OSTERHOUDT  Procedure(s) Performed: ENDARTERECTOMY CAROTID (Right )  Patient location during evaluation: ICU Anesthesia Type: General Level of consciousness: awake, awake and alert and oriented Pain management: pain level controlled Vital Signs Assessment: post-procedure vital signs reviewed and stable Respiratory status: spontaneous breathing Cardiovascular status: blood pressure returned to baseline Postop Assessment: no apparent nausea or vomiting and adequate PO intake Anesthetic complications: no     Last Vitals:  Vitals:   09/29/18 0500 09/29/18 0600  BP: 116/69 128/71  Pulse: (!) 42 (!) 45  Resp: 16 17  Temp:    SpO2: 100% 99%    Last Pain:  Vitals:   09/29/18 0400  TempSrc: Oral  PainSc: 0-No pain                 Jarriel Papillion Lawerance Cruel

## 2018-09-29 NOTE — Progress Notes (Signed)
PHARMACIST - PHYSICIAN COMMUNICATION  DR:   Wyn Quaker  CONCERNING: IV to Oral Route Change Policy  RECOMMENDATION: This patient is receiving Famotidine by the intravenous route.  Based on criteria approved by the Pharmacy and Therapeutics Committee, the intravenous medication(s) is/are being converted to the equivalent oral dose form(s).   DESCRIPTION: These criteria include:  The patient is eating (either orally or via tube) and/or has been taking other orally administered medications for a least 24 hours  The patient has no evidence of active gastrointestinal bleeding or impaired GI absorption (gastrectomy, short bowel, patient on TNA or NPO).  If you have questions about this conversion, please contact the Pharmacy Department  []   [phone] )  [facility] [x]   [phone] )  [facility] []   [phone] )  [facility] []   [phone] )  [facility] []   [phone] )  [facility]   Foye Deer, [facility] 09/29/2018 8:59 AM

## 2018-09-29 NOTE — Progress Notes (Signed)
Discharge instructions and medications reviewed with patient. All questions answered to patient's satisfaction. Pt given clothes back to get dressed with no assistance. IV removed. Awaiting wife to take patient down to [facility] entrance for discharge.

## 2018-09-29 NOTE — Progress Notes (Signed)
After initiation of dopamine drip around 2130 patient vomited twice. Dopamine drip paused and nausea and vomiting ceased. Patients blood pressure maintained above 100 systolic. Patient continues to be bradycardiac; otherwise VSS. No changes in neuro status, afebrile, and incision is intact. Foley catheter removed this AM. Patient is currently in no distress.

## 2018-09-29 NOTE — Plan of Care (Signed)
Discharge order in for patient to go home. Wife aware and is on the way to pick patient up and is to call unit when she is outside [facility]. Pt has walked around unit and voided this morning, tolerating diet.

## 2018-10-02 ENCOUNTER — Other Ambulatory Visit: Payer: Self-pay

## 2018-10-02 LAB — SURGICAL PATHOLOGY

## 2018-10-02 NOTE — Patient Outreach (Signed)
[facility]) Care Management  10/02/2018  Robert Roy 1946-05-11 [id number]    EMMI-General Discharge RED ON EMMI ALERT Day # 1 Date: 10/01/2018 Red Alert Reason: " Scheduled follow up? No"   Outreach attempt # 1 to patient.  Spoke with patient who voiced that he is [facility] and requested call be completed with spouse. Spoke with spouse who voices that patient is doing fine and things are going well. She denies any acute issues or concerns at this time. Reviewed and addressed red alert. Spouse voices that surgeon office will call and check on patient in a few weeks. Patient is not due to be seen int he office until June per d/c instructions and given the virus outbreak. Spouse confirms that patient has all his meds in the home and no issues or concerns regarding them. No further RN CM needs or concerns at this time. Advised spouse that they would get one more automated EMMI-GENERAL post discharge calls to assess how they are doing following recent hospitalization and will receive a call from a nurse if any of their responses were abnormal. She voiced understanding and was appreciative of f/u call.    Plan: RN CM will close case as no further interventions needed at this time.  Antionette Fairy, RN,BSN,CCM [facility] Care Management Telephonic Care Management Coordinator Direct Phone: [phone] Toll Free: [phone] Fax: [phone]

## 2018-10-05 ENCOUNTER — Other Ambulatory Visit: Payer: Self-pay

## 2018-10-05 NOTE — Patient Outreach (Signed)
[facility]) Care Management  10/05/2018  Robert Roy June 05, 1946 [id number]    EMMI-General Discharge RED ON EMMI ALERT Day # 4 Date: 10/04/2018 Red Alert Reason: " Scheduled follow up? No"    Red on EMMI dashboard received. No outreach call warranted to patient at this time. RN CM addressed issue on previous call.[name] has follow up appt with surgeon on 12/30/2018 due to virus outbreak.    Plan: RN CM will close case at this time.   Antionette Fairy, RN,BSN,CCM [facility] Care Management Telephonic Care Management Coordinator Direct Phone: [phone] Toll Free: [phone] Fax: [phone]

## 2018-10-14 ENCOUNTER — Inpatient Hospital Stay: Payer: Medicare HMO | Admitting: Anesthesiology

## 2018-10-14 ENCOUNTER — Emergency Department: Payer: Medicare HMO

## 2018-10-14 ENCOUNTER — Encounter: Payer: Self-pay | Admitting: Emergency Medicine

## 2018-10-14 ENCOUNTER — Encounter
Admission: EM | Disposition: A | Discharge status: Home / Self Care | Payer: Self-pay | Source: Home / Self Care | Attending: Internal Medicine

## 2018-10-14 ENCOUNTER — Other Ambulatory Visit: Payer: Self-pay

## 2018-10-14 ENCOUNTER — Inpatient Hospital Stay
Admission: EM | Admit: 2018-10-14 | Discharge: 2018-10-16 | DRG: 857 | Disposition: A | Discharge status: Home / Self Care | Payer: Medicare HMO | Attending: Internal Medicine | Admitting: Internal Medicine

## 2018-10-14 DIAGNOSIS — E785 Hyperlipidemia, unspecified: Secondary | ICD-10-CM | POA: Diagnosis not present

## 2018-10-14 DIAGNOSIS — Z8249 Family history of ischemic heart disease and other diseases of the circulatory system: Secondary | ICD-10-CM | POA: Diagnosis not present

## 2018-10-14 DIAGNOSIS — I6529 Occlusion and stenosis of unspecified carotid artery: Secondary | ICD-10-CM | POA: Diagnosis present

## 2018-10-14 DIAGNOSIS — M542 Cervicalgia: Secondary | ICD-10-CM | POA: Diagnosis present

## 2018-10-14 DIAGNOSIS — I6521 Occlusion and stenosis of right carotid artery: Secondary | ICD-10-CM | POA: Diagnosis not present

## 2018-10-14 DIAGNOSIS — L03221 Cellulitis of neck: Secondary | ICD-10-CM | POA: Diagnosis not present

## 2018-10-14 DIAGNOSIS — Z9889 Other specified postprocedural states: Secondary | ICD-10-CM | POA: Diagnosis not present

## 2018-10-14 DIAGNOSIS — T8149XA Infection following a procedure, other surgical site, initial encounter: Secondary | ICD-10-CM | POA: Diagnosis present

## 2018-10-14 DIAGNOSIS — Z87442 Personal history of urinary calculi: Secondary | ICD-10-CM | POA: Diagnosis not present

## 2018-10-14 DIAGNOSIS — Z66 Do not resuscitate: Secondary | ICD-10-CM | POA: Diagnosis present

## 2018-10-14 DIAGNOSIS — R030 Elevated blood-pressure reading, without diagnosis of hypertension: Secondary | ICD-10-CM | POA: Diagnosis not present

## 2018-10-14 DIAGNOSIS — Y838 Other surgical procedures as the cause of abnormal reaction of the patient, or of later complication, without mention of misadventure at the time of the procedure: Secondary | ICD-10-CM | POA: Diagnosis present

## 2018-10-14 DIAGNOSIS — L02211 Cutaneous abscess of abdominal wall: Secondary | ICD-10-CM | POA: Diagnosis not present

## 2018-10-14 DIAGNOSIS — I129 Hypertensive chronic kidney disease with stage 1 through stage 4 chronic kidney disease, or unspecified chronic kidney disease: Secondary | ICD-10-CM | POA: Diagnosis not present

## 2018-10-14 DIAGNOSIS — I6522 Occlusion and stenosis of left carotid artery: Secondary | ICD-10-CM | POA: Diagnosis not present

## 2018-10-14 DIAGNOSIS — N183 Chronic kidney disease, stage 3 (moderate): Secondary | ICD-10-CM | POA: Diagnosis not present

## 2018-10-14 DIAGNOSIS — T8141XA Infection following a procedure, superficial incisional surgical site, initial encounter: Secondary | ICD-10-CM | POA: Diagnosis not present

## 2018-10-14 DIAGNOSIS — Z7982 Long term (current) use of aspirin: Secondary | ICD-10-CM | POA: Diagnosis not present

## 2018-10-14 DIAGNOSIS — Z7902 Long term (current) use of antithrombotics/antiplatelets: Secondary | ICD-10-CM | POA: Diagnosis not present

## 2018-10-14 DIAGNOSIS — L0211 Cutaneous abscess of neck: Secondary | ICD-10-CM | POA: Diagnosis not present

## 2018-10-14 DIAGNOSIS — T8189XA Other complications of procedures, not elsewhere classified, initial encounter: Secondary | ICD-10-CM | POA: Diagnosis not present

## 2018-10-14 DIAGNOSIS — I10 Essential (primary) hypertension: Secondary | ICD-10-CM | POA: Diagnosis not present

## 2018-10-14 DIAGNOSIS — I493 Ventricular premature depolarization: Secondary | ICD-10-CM | POA: Diagnosis not present

## 2018-10-14 DIAGNOSIS — Z79899 Other long term (current) drug therapy: Secondary | ICD-10-CM

## 2018-10-14 DIAGNOSIS — T8579XA Infection and inflammatory reaction due to other internal prosthetic devices, implants and grafts, initial encounter: Secondary | ICD-10-CM | POA: Diagnosis not present

## 2018-10-14 HISTORY — PX: I & D EXTREMITY: SHX5045

## 2018-10-14 LAB — COMPREHENSIVE METABOLIC PANEL
ALT: 15 U/L (ref 0–44)
AST: 17 U/L (ref 15–41)
Albumin: 4.1 g/dL (ref 3.5–5.0)
Alkaline Phosphatase: 91 U/L (ref 38–126)
Anion gap: 7 (ref 5–15)
BUN: 21 mg/dL (ref 8–23)
CO2: 26 mmol/L (ref 22–32)
Calcium: 10.9 mg/dL — ABNORMAL HIGH (ref 8.9–10.3)
Chloride: 107 mmol/L (ref 98–111)
Creatinine, Ser: 1.26 mg/dL — ABNORMAL HIGH (ref 0.61–1.24)
GFR calc Af Amer: >60 mL/min (ref >60)
GFR calc non Af Amer: 57 mL/min — ABNORMAL LOW (ref >60)
Glucose, Bld: 103 mg/dL — ABNORMAL HIGH (ref 70–99)
Potassium: 4.4 mmol/L (ref 3.5–5.1)
Sodium: 140 mmol/L (ref 135–145)
Total Bilirubin: 1 mg/dL (ref 0.3–1.2)
Total Protein: 7.8 g/dL (ref 6.5–8.1)

## 2018-10-14 LAB — PROTIME-INR
INR: 1.1 (ref 0.8–1.2)
Prothrombin Time: 13.9 seconds (ref 11.4–15.2)

## 2018-10-14 LAB — CBC
HCT: 47.9 % (ref 39.0–52.0)
Hemoglobin: 15.8 g/dL (ref 13.0–17.0)
MCH: 30.4 pg (ref 26.0–34.0)
MCHC: 33 g/dL (ref 30.0–36.0)
MCV: 92.1 fL (ref 80.0–100.0)
Platelets: 254 10*3/uL (ref 150–400)
RBC: 5.2 MIL/uL (ref 4.22–5.81)
RDW: 12.6 % (ref 11.5–15.5)
WBC: 14.3 10*3/uL — ABNORMAL HIGH (ref 4.0–10.5)
nRBC: 0 % (ref 0.0–0.2)

## 2018-10-14 LAB — APTT: aPTT: 29 seconds (ref 24–36)

## 2018-10-14 LAB — MAGNESIUM: Magnesium: 2.3 mg/dL (ref 1.7–2.4)

## 2018-10-14 SURGERY — IRRIGATION AND DEBRIDEMENT EXTREMITY
Anesthesia: General | Laterality: Right

## 2018-10-14 MED ORDER — VANCOMYCIN HCL IN DEXTROSE 1-5 GM/200ML-% IV SOLN: 1000.0000 mg | Freq: Once | INTRAVENOUS | Status: DC

## 2018-10-14 MED ORDER — HEPARIN SODIUM (PORCINE) 5000 UNIT/ML IJ SOLN
5000.0000 [IU] | Freq: Three times a day (TID) | INTRAMUSCULAR | Status: DC
  Administered 2018-10-14 – 2018-10-16 (×5): 5000 [IU] via SUBCUTANEOUS
  Filled 2018-10-14 (×5): qty 1

## 2018-10-14 MED ORDER — HEPARIN SODIUM (PORCINE) 1000 UNIT/ML IJ SOLN
INTRAMUSCULAR | Status: AC
  Filled 2018-10-14: qty 1

## 2018-10-14 MED ORDER — ROCURONIUM BROMIDE 100 MG/10ML IV SOLN
INTRAVENOUS | Status: DC | PRN
  Administered 2018-10-14: 20 mg via INTRAVENOUS
  Administered 2018-10-14 (×2): 30 mg via INTRAVENOUS
  Administered 2018-10-14: 20 mg via INTRAVENOUS

## 2018-10-14 MED ORDER — SODIUM CHLORIDE 0.9 % IV SOLN
2.0000 g | Freq: Three times a day (TID) | INTRAVENOUS | Status: DC
  Administered 2018-10-14 – 2018-10-16 (×5): 2 g via INTRAVENOUS
  Filled 2018-10-14 (×8): qty 2

## 2018-10-14 MED ORDER — DEXAMETHASONE SODIUM PHOSPHATE 10 MG/ML IJ SOLN
INTRAMUSCULAR | Status: DC | PRN
  Administered 2018-10-14: 10 mg via INTRAVENOUS

## 2018-10-14 MED ORDER — IOHEXOL 350 MG/ML SOLN
75.0000 mL | Freq: Once | INTRAVENOUS | Status: AC | PRN
  Administered 2018-10-14: 75 mL via INTRAVENOUS

## 2018-10-14 MED ORDER — PIPERACILLIN-TAZOBACTAM 3.375 G IVPB
INTRAVENOUS | Status: AC
  Filled 2018-10-14: qty 50

## 2018-10-14 MED ORDER — MORPHINE SULFATE (PF) 2 MG/ML IV SOLN: 1.0000 mg | INTRAVENOUS | Status: DC | PRN

## 2018-10-14 MED ORDER — SODIUM CHLORIDE 0.9 % IV SOLN
INTRAVENOUS | Status: DC | PRN
  Administered 2018-10-14: 250 mL via INTRAMUSCULAR

## 2018-10-14 MED ORDER — PIPERACILLIN-TAZOBACTAM 3.375 G IVPB 30 MIN
3.3750 g | Freq: Once | INTRAVENOUS | Status: AC
  Administered 2018-10-14: 3.375 g via INTRAVENOUS
  Filled 2018-10-14: qty 50

## 2018-10-14 MED ORDER — ONDANSETRON HCL 4 MG PO TABS: 4.0000 mg | ORAL_TABLET | Freq: Four times a day (QID) | ORAL | Status: DC | PRN

## 2018-10-14 MED ORDER — SUGAMMADEX SODIUM 200 MG/2ML IV SOLN
INTRAVENOUS | Status: AC
  Filled 2018-10-14: qty 2

## 2018-10-14 MED ORDER — FENTANYL CITRATE (PF) 100 MCG/2ML IJ SOLN
INTRAMUSCULAR | Status: DC | PRN
  Administered 2018-10-14: 50 ug via INTRAVENOUS
  Administered 2018-10-14: 100 ug via INTRAVENOUS

## 2018-10-14 MED ORDER — PROPOFOL 10 MG/ML IV BOLUS
INTRAVENOUS | Status: AC
  Filled 2018-10-14: qty 20

## 2018-10-14 MED ORDER — PROTAMINE SULFATE 10 MG/ML IV SOLN
INTRAVENOUS | Status: AC
  Filled 2018-10-14: qty 5

## 2018-10-14 MED ORDER — ONDANSETRON HCL 4 MG/2ML IJ SOLN
INTRAMUSCULAR | Status: DC | PRN
  Administered 2018-10-14: 4 mg via INTRAVENOUS

## 2018-10-14 MED ORDER — THROMBIN 20000 UNITS EX KIT
PACK | CUTANEOUS | Status: AC
  Filled 2018-10-14: qty 1

## 2018-10-14 MED ORDER — NICARDIPINE HCL IN NACL 20-0.86 MG/200ML-% IV SOLN
3.0000 mg/h | INTRAVENOUS | Status: DC
  Filled 2018-10-14: qty 200

## 2018-10-14 MED ORDER — HYDRALAZINE HCL 20 MG/ML IJ SOLN
10.0000 mg | Freq: Four times a day (QID) | INTRAMUSCULAR | Status: DC | PRN
  Administered 2018-10-14 (×2): 10 mg via INTRAVENOUS
  Filled 2018-10-14 (×2): qty 1

## 2018-10-14 MED ORDER — SODIUM CHLORIDE 0.9 % IV SOLN
INTRAVENOUS | Status: DC | PRN
  Administered 2018-10-14: 25 ug/min via INTRAVENOUS

## 2018-10-14 MED ORDER — ACETAMINOPHEN 325 MG PO TABS: 650.0000 mg | ORAL_TABLET | Freq: Four times a day (QID) | ORAL | Status: DC | PRN

## 2018-10-14 MED ORDER — LIDOCAINE HCL (PF) 2 % IJ SOLN
INTRAMUSCULAR | Status: AC
  Filled 2018-10-14: qty 10

## 2018-10-14 MED ORDER — PROTAMINE SULFATE 10 MG/ML IV SOLN
INTRAVENOUS | Status: DC | PRN
  Administered 2018-10-14: 50 mg via INTRAVENOUS

## 2018-10-14 MED ORDER — CEFAZOLIN SODIUM 1 G IJ SOLR
INTRAMUSCULAR | Status: AC
  Filled 2018-10-14: qty 20

## 2018-10-14 MED ORDER — PHENYLEPHRINE HCL (PRESSORS) 10 MG/ML IV SOLN
INTRAVENOUS | Status: AC
  Filled 2018-10-14: qty 1

## 2018-10-14 MED ORDER — ACETAMINOPHEN 650 MG RE SUPP: 650.0000 mg | Freq: Four times a day (QID) | RECTAL | Status: DC | PRN

## 2018-10-14 MED ORDER — ONDANSETRON HCL 4 MG/2ML IJ SOLN: 4.0000 mg | Freq: Four times a day (QID) | INTRAMUSCULAR | Status: DC | PRN

## 2018-10-14 MED ORDER — VASOPRESSIN 20 UNIT/ML IV SOLN
INTRAVENOUS | Status: AC
  Filled 2018-10-14: qty 1

## 2018-10-14 MED ORDER — GLYCOPYRROLATE 0.2 MG/ML IJ SOLN
INTRAMUSCULAR | Status: AC
  Filled 2018-10-14: qty 1

## 2018-10-14 MED ORDER — HEPARIN SODIUM (PORCINE) 5000 UNIT/ML IJ SOLN
INTRAMUSCULAR | Status: AC
  Filled 2018-10-14: qty 1

## 2018-10-14 MED ORDER — VASOPRESSIN 20 UNIT/ML IV SOLN
INTRAVENOUS | Status: DC | PRN
  Administered 2018-10-14: 1 [IU] via INTRAVENOUS
  Administered 2018-10-14: 2 [IU] via INTRAVENOUS
  Administered 2018-10-14: 1 [IU] via INTRAVENOUS
  Administered 2018-10-14 (×3): 2 [IU] via INTRAVENOUS
  Administered 2018-10-14: 1 [IU] via INTRAVENOUS
  Administered 2018-10-14: 2 [IU] via INTRAVENOUS

## 2018-10-14 MED ORDER — ALBUTEROL SULFATE (2.5 MG/3ML) 0.083% IN NEBU: 2.5000 mg | INHALATION_SOLUTION | RESPIRATORY_TRACT | Status: DC | PRN

## 2018-10-14 MED ORDER — FENTANYL CITRATE (PF) 100 MCG/2ML IJ SOLN
INTRAMUSCULAR | Status: AC
  Filled 2018-10-14: qty 2

## 2018-10-14 MED ORDER — HYDRALAZINE HCL 20 MG/ML IJ SOLN: 10.0000 mg | Freq: Four times a day (QID) | INTRAMUSCULAR | Status: DC | PRN

## 2018-10-14 MED ORDER — AMLODIPINE BESYLATE 5 MG PO TABS
5.0000 mg | ORAL_TABLET | Freq: Every day | ORAL | Status: DC
  Administered 2018-10-14 – 2018-10-16 (×3): 5 mg via ORAL
  Filled 2018-10-14 (×3): qty 1

## 2018-10-14 MED ORDER — ROCURONIUM BROMIDE 50 MG/5ML IV SOLN
INTRAVENOUS | Status: AC
  Filled 2018-10-14: qty 1

## 2018-10-14 MED ORDER — VANCOMYCIN HCL IN DEXTROSE 1-5 GM/200ML-% IV SOLN
1000.0000 mg | Freq: Once | INTRAVENOUS | Status: AC
  Administered 2018-10-14: 1000 mg via INTRAVENOUS
  Filled 2018-10-14: qty 200

## 2018-10-14 MED ORDER — SODIUM CHLORIDE FLUSH 0.9 % IV SOLN
INTRAVENOUS | Status: AC
  Filled 2018-10-14: qty 10

## 2018-10-14 MED ORDER — LIDOCAINE HCL (CARDIAC) PF 100 MG/5ML IV SOSY
PREFILLED_SYRINGE | INTRAVENOUS | Status: DC | PRN
  Administered 2018-10-14: 60 mg via INTRAVENOUS

## 2018-10-14 MED ORDER — DEXAMETHASONE SODIUM PHOSPHATE 10 MG/ML IJ SOLN
INTRAMUSCULAR | Status: AC
  Filled 2018-10-14: qty 1

## 2018-10-14 MED ORDER — TRAMADOL HCL 50 MG PO TABS
50.0000 mg | ORAL_TABLET | Freq: Four times a day (QID) | ORAL | Status: DC | PRN
  Administered 2018-10-14: 50 mg via ORAL
  Filled 2018-10-14: qty 1

## 2018-10-14 MED ORDER — SUCCINYLCHOLINE CHLORIDE 20 MG/ML IJ SOLN
INTRAMUSCULAR | Status: AC
  Filled 2018-10-14: qty 1

## 2018-10-14 MED ORDER — LACTATED RINGERS IV SOLN
INTRAVENOUS | Status: DC | PRN
  Administered 2018-10-14: 15:00:00 via INTRAVENOUS

## 2018-10-14 MED ORDER — SUGAMMADEX SODIUM 200 MG/2ML IV SOLN
INTRAVENOUS | Status: DC | PRN
  Administered 2018-10-14: 170 mg via INTRAVENOUS

## 2018-10-14 MED ORDER — PHENYLEPHRINE HCL (PRESSORS) 10 MG/ML IV SOLN
INTRAVENOUS | Status: DC | PRN
  Administered 2018-10-14: 100 ug via INTRAVENOUS
  Administered 2018-10-14: 200 ug via INTRAVENOUS
  Administered 2018-10-14 (×5): 100 ug via INTRAVENOUS

## 2018-10-14 MED ORDER — POLYETHYLENE GLYCOL 3350 17 G PO PACK: 17.0000 g | PACK | Freq: Every day | ORAL | Status: DC | PRN

## 2018-10-14 MED ORDER — ONDANSETRON HCL 4 MG/2ML IJ SOLN
INTRAMUSCULAR | Status: AC
  Filled 2018-10-14: qty 2

## 2018-10-14 MED ORDER — THROMBIN 20000 UNITS EX SOLR
CUTANEOUS | Status: DC | PRN
  Administered 2018-10-14: 20000 [IU] via TOPICAL

## 2018-10-14 MED ORDER — VANCOMYCIN HCL 10 G IV SOLR
1750.0000 mg | INTRAVENOUS | Status: DC
  Administered 2018-10-15 – 2018-10-16 (×2): 1750 mg via INTRAVENOUS
  Filled 2018-10-14 (×3): qty 1750

## 2018-10-14 MED ORDER — PROPOFOL 10 MG/ML IV BOLUS
INTRAVENOUS | Status: DC | PRN
  Administered 2018-10-14: 150 mg via INTRAVENOUS

## 2018-10-14 MED ORDER — HEPARIN SODIUM (PORCINE) 1000 UNIT/ML IJ SOLN
INTRAMUSCULAR | Status: DC | PRN
  Administered 2018-10-14: 3000 [IU] via INTRAVENOUS
  Administered 2018-10-14: 2500 [IU] via INTRAVENOUS

## 2018-10-14 MED ORDER — CEFAZOLIN SODIUM-DEXTROSE 2-3 GM-%(50ML) IV SOLR
INTRAVENOUS | Status: DC | PRN
  Administered 2018-10-14: 2 g via INTRAVENOUS

## 2018-10-14 SURGICAL SUPPLY — 66 items
"PENCIL ELECTRO HAND CTR " (MISCELLANEOUS) IMPLANT
BAG DECANTER FOR FLEXI CONT (MISCELLANEOUS) ×3 IMPLANT
BLADE SURG 15 STRL LF DISP TIS (BLADE) ×1 IMPLANT
BLADE SURG 15 STRL SS (BLADE) ×2
BLADE SURG SZ11 CARB STEEL (BLADE) ×3 IMPLANT
BOOT SUTURE AID YELLOW STND (SUTURE) ×3 IMPLANT
BRUSH SCRUB EZ  4% CHG (MISCELLANEOUS)
BRUSH SCRUB EZ 4% CHG (MISCELLANEOUS) ×1 IMPLANT
BULB RESERV EVAC DRAIN JP 100C (MISCELLANEOUS) ×2 IMPLANT
CANISTER SUCT 1200ML W/VALVE (MISCELLANEOUS) ×3 IMPLANT
CANISTER SUCT 3000ML PPV (MISCELLANEOUS) ×2 IMPLANT
CHLORAPREP W/TINT 26ML (MISCELLANEOUS) ×5 IMPLANT
COVER WAND RF STERILE (DRAPES) ×1 IMPLANT
DERMABOND ADVANCED (GAUZE/BANDAGES/DRESSINGS) ×2
DERMABOND ADVANCED .7 DNX12 (GAUZE/BANDAGES/DRESSINGS) ×1 IMPLANT
DRAIN CHANNEL JP 15F RND 16 (MISCELLANEOUS) ×2 IMPLANT
DRAPE INCISE IOBAN 66X45 STRL (DRAPES) ×3 IMPLANT
DRAPE LAPAROTOMY 77X122 PED (DRAPES) ×5 IMPLANT
DRAPE SHEET LG 3/4 BI-LAMINATE (DRAPES) ×3 IMPLANT
ELECT CAUTERY BLADE 6.4 (BLADE) ×3 IMPLANT
ELECT REM PT RETURN 9FT ADLT (ELECTROSURGICAL) ×3
ELECTRODE REM PT RTRN 9FT ADLT (ELECTROSURGICAL) ×1 IMPLANT
GLOVE BIO SURGEON STRL SZ7 (GLOVE) ×3 IMPLANT
GLOVE INDICATOR 7.5 STRL GRN (GLOVE) ×9 IMPLANT
GOWN STRL REUS W/ TWL LRG LVL3 (GOWN DISPOSABLE) ×2 IMPLANT
GOWN STRL REUS W/ TWL XL LVL3 (GOWN DISPOSABLE) ×2 IMPLANT
GOWN STRL REUS W/TWL LRG LVL3 (GOWN DISPOSABLE) ×4
GOWN STRL REUS W/TWL XL LVL3 (GOWN DISPOSABLE) ×4
HEMOSTAT SURGICEL 2X3 (HEMOSTASIS) ×5 IMPLANT
IV NS 250ML (IV SOLUTION) ×2
IV NS 250ML BAXH (IV SOLUTION) ×1 IMPLANT
KIT TURNOVER KIT A (KITS) ×3 IMPLANT
LABEL OR SOLS (LABEL) ×3 IMPLANT
LOOP RED MAXI  1X406MM (MISCELLANEOUS) ×4
LOOP VESSEL MAXI 1X406 RED (MISCELLANEOUS) ×2 IMPLANT
LOOP VESSEL MINI 0.8X406 BLUE (MISCELLANEOUS) ×1 IMPLANT
LOOPS BLUE MINI 0.8X406MM (MISCELLANEOUS) ×2
NDL FILTER BLUNT 18X1 1/2 (NEEDLE) ×1 IMPLANT
NDL HYPO 25X1 1.5 SAFETY (NEEDLE) ×1 IMPLANT
NEEDLE FILTER BLUNT 18X 1/2SAF (NEEDLE) ×2
NEEDLE FILTER BLUNT 18X1 1/2 (NEEDLE) ×1 IMPLANT
NEEDLE HYPO 25X1 1.5 SAFETY (NEEDLE) ×3 IMPLANT
NS IRRIG 500ML POUR BTL (IV SOLUTION) ×3 IMPLANT
PACK BASIN MAJOR ARMC (MISCELLANEOUS) ×3 IMPLANT
PENCIL ELECTRO HAND CTR (MISCELLANEOUS) IMPLANT
PLEDGET CV PTFE 7X3 (MISCELLANEOUS) ×2 IMPLANT
SHUNT W TPORT 9FR PRUITT F3 (SHUNT) ×3 IMPLANT
SUT MNCRL 4-0 (SUTURE) ×4
SUT MNCRL 4-0 27XMFL (SUTURE) ×2
SUT PROLENE 6 0 BV (SUTURE) ×10 IMPLANT
SUT PROLENE 7 0 BV 1 (SUTURE) ×2 IMPLANT
SUT SILK 2 0 (SUTURE) ×2
SUT SILK 2-0 18XBRD TIE 12 (SUTURE) ×1 IMPLANT
SUT SILK 3 0 (SUTURE) ×2
SUT SILK 3-0 18XBRD TIE 12 (SUTURE) ×1 IMPLANT
SUT SILK 4 0 (SUTURE) ×2
SUT SILK 4-0 18XBRD TIE 12 (SUTURE) ×1 IMPLANT
SUT VIC AB 3-0 SH 27 (SUTURE) ×4
SUT VIC AB 3-0 SH 27X BRD (SUTURE) ×2 IMPLANT
SUTURE MNCRL 4-0 27XMF (SUTURE) ×1 IMPLANT
SYR 10ML LL (SYRINGE) ×6 IMPLANT
SYR 20CC LL (SYRINGE) ×3 IMPLANT
SYR BULB 3OZ (MISCELLANEOUS) ×2 IMPLANT
TRAY FOLEY MTR SLVR 16FR STAT (SET/KITS/TRAYS/PACK) ×1 IMPLANT
TUBING CONNECTING 10 (TUBING) IMPLANT
TUBING CONNECTING 10' (TUBING)

## 2018-10-14 NOTE — ED Provider Notes (Signed)
[facility] ([facility]) Emergency Department Provider Note   ____________________________________________    I have reviewed the triage vital signs and the nursing notes.   HISTORY  Chief Complaint Post-op Problem     HPI Robert Roy is a 73 y.o. male who presents with complaints of swelling and discomfort in the right side of his neck.  Patient had carotid endarterectomy on March 26 with Dr. Wyn Quaker, reports he has been doing quite well and is been very pleased with his recovery.  However yesterday developed swelling around the incision with some tenderness and erythema.  No difficulty breathing or swallowing.  Denies fevers or chills  Past Medical History:  Diagnosis Date  . BMI 28.0-28.9,adult   . History of kidney stones    h/o    Patient Active Problem List   Diagnosis Date Noted  . Neck abscess 10/14/2018  . Carotid stenosis, right 09/28/2018  . Essential hypertension 08/29/2018  . Abnormal carotid duplex scan 08/09/2018  . Carotid stenosis 08/09/2018  . PVC (premature ventricular contraction) 01/16/2018  . Carotid bruit 01/16/2018  . DOE (dyspnea on exertion) 01/16/2018    Past Surgical History:  Procedure Laterality Date  . ENDARTERECTOMY Right 09/28/2018   Procedure: ENDARTERECTOMY CAROTID;  Surgeon: Annice Needy, MD;  Location: [facility] ORS;  Service: Vascular;  Laterality: Right;  . KIDNEY STONE SURGERY      Prior to Admission medications   Medication Sig Start Date End Date Taking? Authorizing Provider  acetaminophen (TYLENOL) 325 MG tablet Take 1-2 tablets (325-650 mg total) by mouth every 6 (six) hours as needed for mild pain or moderate pain (or temp >/= 101 F). 09/29/18  Yes Stegmayer, Ranae Plumber, PA-C  aspirin 81 MG EC tablet Take 1 tablet (81 mg total) by mouth daily. 09/30/18  Yes Stegmayer, Cala Bradford A, PA-C  atorvastatin (LIPITOR) 10 MG tablet Take 1 tablet (10 mg total) by mouth at bedtime. 09/29/18  Yes Stegmayer, Ranae Plumber, PA-C   clopidogrel (PLAVIX) 75 MG tablet Take 1 tablet (75 mg total) by mouth daily with breakfast. 09/30/18  Yes Stegmayer, Ranae Plumber, PA-C     Allergies Patient has no known allergies.  Family History  Problem Relation Age of Onset  . Hypertension Mother   . Heart disease Father   . Prostate cancer Father     Social History Social History   Tobacco Use  . Smoking status: Never Smoker  . Smokeless tobacco: Never Used  Substance Use Topics  . Alcohol use: Never    Frequency: Never  . Drug use: Never    Review of Systems  Constitutional: No fever/chills Eyes: No visual changes.  ENT: As above. Cardiovascular: Denies chest pain. Respiratory: Denies shortness of breath. Gastrointestinal: No abdominal pain.   Genitourinary: Negative for dysuria. Musculoskeletal: Negative for back pain. Skin: Negative for rash. Neurological: Negative for headaches    ____________________________________________   PHYSICAL EXAM:  VITAL SIGNS: ED Triage Vitals  Enc Vitals Group     BP 10/14/18 1017 (!) 153/74     Pulse Rate 10/14/18 1017 81     Resp 10/14/18 1017 16     Temp 10/14/18 1017 98.2 F (36.8 C)     Temp Source 10/14/18 1017 Oral     SpO2 10/14/18 1017 98 %     Weight 10/14/18 1018 83.9 kg (185 lb)     Height 10/14/18 1018 1.88 m (6\' 2" )     Head Circumference --      Peak Flow --  Pain Score 10/14/18 1017 6     Pain Loc --      Pain Edu? --      Excl. in GC? --     Constitutional: Alert and oriented.  Eyes: Conjunctivae are normal.   Nose: No congestion/rhinnorhea. Mouth/Throat: Mucous membranes are moist.   Neck: Noticeable swelling to the incision site on the right with mild erythema, no purulent discharge, no bleeding, no pulsatility suspicious for cellulitis/infection Cardiovascular: Normal rate, regular rhythm. Grossly normal heart sounds.  Good peripheral circulation. Respiratory: Normal respiratory effort.  No retractions. Lungs CTAB. Gastrointestinal:  Soft and nontender. No distention.   Musculoskeletal:   Warm and well perfused Neurologic:  Normal speech and language. No gross focal neurologic deficits are appreciated.  Skin:  Skin is warm, dry and intact. No rash noted. Psychiatric: Mood and affect are normal. Speech and behavior are normal.  ____________________________________________   LABS (all labs ordered are listed, but only abnormal results are displayed)  Labs Reviewed  CBC - Abnormal; Notable for the following components:      Result Value   WBC 14.3 (*)    All other components within normal limits  COMPREHENSIVE METABOLIC PANEL - Abnormal; Notable for the following components:   Glucose, Bld 103 (*)    Creatinine, Ser 1.26 (*)    Calcium 10.9 (*)    GFR calc non Af Amer 57 (*)    All other components within normal limits  APTT  PROTIME-INR   ____________________________________________  EKG  None ____________________________________________  RADIOLOGY  CT angiography consistent with abscess, infection, contacted by radiologist regarding this ____________________________________________   PROCEDURES  Procedure(s) performed: No  Procedures   Critical Care performed: No ____________________________________________   INITIAL IMPRESSION / ASSESSMENT AND PLAN / ED COURSE  Pertinent labs & imaging results that were available during my care of the patient were reviewed by me and considered in my medical decision making (see chart for details).  Discussed with Dr. Norma FredricksonFarr of vascular surgery who recommends CT angiography to evaluate for infection versus patch blowout.  Patient stable and comfortable, pending labs, imaging  CT demonstrates findings concerning for abscess/infection, will start the patient on IV vancomycin and Zosyn, discussed with vascular surgeon who will take the patient to the OR for washout in the morning, hospitalist will admit    ____________________________________________    FINAL CLINICAL IMPRESSION(S) / ED DIAGNOSES  Final diagnoses:  Post-operative wound abscess        Note:  This document was prepared using Dragon voice recognition software and may include unintentional dictation errors.   [name], Gaynel Schaafsma, MD 10/14/18 1309

## 2018-10-14 NOTE — Interval H&P Note (Signed)
History and Physical Interval Note:  10/14/2018 2:05 PM  Robert Roy  has presented today for surgery, with the diagnosis of right neck abscess.  The various methods of treatment have been discussed with the patient and family. After consideration of risks, benefits and other options for treatment, the patient has consented to  Procedure(s): IRRIGATION AND DEBRIDEMENT EXTREMITY (Right) as a surgical intervention.  The patient's history has been reviewed, patient examined, no change in status, stable for surgery.  I have reviewed the patient's chart and labs.  Questions were answered to the patient's satisfaction.     Khadeem Rockett A Elma Shands

## 2018-10-14 NOTE — Anesthesia Postprocedure Evaluation (Signed)
Anesthesia Post Note  Patient: Robert Roy  Procedure(s) Performed: exploration right neck,drainage abscess,drain placement, carotid patch removal,saphenous vein harvest (Right )  Patient location during evaluation: PACU Anesthesia Type: General Level of consciousness: awake and alert Pain management: pain level controlled Vital Signs Assessment: post-procedure vital signs reviewed and stable Respiratory status: spontaneous breathing, nonlabored ventilation, respiratory function stable and patient connected to nasal cannula oxygen Cardiovascular status: blood pressure returned to baseline and stable Postop Assessment: no apparent nausea or vomiting Anesthetic complications: no     Last Vitals:  Vitals:   10/14/18 1242 10/14/18 1836  BP: (!) 141/96 (!) 168/88  Pulse: 77 68  Resp: 16 16  Temp:  (!) 36.3 C  SpO2: 98% 100%    Last Pain:  Vitals:   10/14/18 1836  TempSrc: Axillary  PainSc: 4                  Landry Lookingbill S

## 2018-10-14 NOTE — Progress Notes (Addendum)
Pt admitted to ICU alert and oriented x 4 with c/o right neck pain 4/10, sore/achy.  Pt is in NSR on cardiac monitor.  Pt transitioned to 4L face mask- SpO2 >90%. Lung sounds clear to auscultation.  Bowel sounds active x 4 quadrants.  Pt arrives with personal belongings-wallet with money clip, glasses, 2 hearing aids, cell phone, medications, and set of keys. Will report to oncoming nurse the need to inform security for lock up.

## 2018-10-14 NOTE — Op Note (Signed)
VASCULAR SURGERY OPERATIVE REPORT  DATE OF PROCEDURE: 10/14/2018   ATTENDING Surgeon(s): Renne Crigler, Susanne Borders, MD  ASSISTANT(S): Asher Muir, scrub tech  ANESTHESIA: GETA  PRE-OPERATIVE DIAGNOSIS: Postoperative right cervical abscess, roughly 2 weeks status post right carotid endarterectomy with patch placement  POST-OPERATIVE DIAGNOSIS: Same,  gross purulence encountered  PROCEDURE(S): 1.  Evacuation of abscess with Simpulse lavage 2.  Reexploration of carotid and patch angioplasty with excision of patch 3.  Saphenous patch angioplasty of right common and internal carotid artery with shunt placement 4.  Harvesting right saphenous vein 5.  Drain placement 6.  Continuous-wave Doppler  INTRAOPERATIVE FINDINGS: 1.  Copious amounts of gross purulence upon opening the incision 2.  Severe desmoplastic reaction with friable bleeding tissue present 3.  Good continuous-wave Doppler signals after excision of patch, replacement with saphenous patch, restoration of flow 4.  Good-quality saphenous vein  INTRAOPERATIVE FLUIDS: 1400 mL crystalloid  ESTIMATED BLOOD LOSS: 400 mL  URINE OUTPUT: 0 mL, Not in place SPECIMENS: 1.  Cultures taken 2.  Prior core matrix carotid patch  IMPLANTS: None DRAINS: JP to bulb suction, #15   COMPLICATIONS: None apparent  CONDITION AT COMPLETION: Hemodynamically stable, moving all extremities, and extubated  DISPOSITION: ICU  INDICATION(S) FOR PROCEDURE: Patient is a 73 y.o. male with abscess developing as seen on CT scan 2 weeks postoperatively from previous right carotid endarterectomy with patch angioplasty.  White count was 14.  Large amount of swelling, discomfort, erythema.  The plan was exploration, likely patch excision, saphenous patch replacement, drain placement.  All risks (including but not limited to bleeding, infection, stroke, and death), benefits, and alternatives to above elective procedures were discussed with the patient, who elected to  proceed, and informed consent was accordingly obtained at that time.  DETAILS OF PROCEDURE: Patient was brought to the operating suite and appropriately identified.  General anesthesia was established, and prep and drape performed of both the right side of the neck, and the right thigh to allow access to saphenous vein.  Timeout was performed appropriately for patient and procedure.  Incision was subsequently made with a 10 blade and a large amount of gross purulence was expressed.  Cultures were taken.  Sutures from the previous closure were divided sequentially in layers.  Purulence was evacuated, irrigation carried out.  The patch remained intact, with desmoplastic reaction, however the purulence was in contact with the patch.  Given this fact, a very tedious dissection ensued, with a vessel loop placed around more proximal common carotid artery where this was less adherent to surrounding tissues, and then carried up to the carotid bifurcation itself.  Wheatland's were placed for exposure.  The tissue was very friable.  The proximal external iliac artery tore with slight manipulation, was repaired with over and over 6-0 Prolene suture.  This area was avoided, dissecting higher up to allow placement of a clamp.  The internal was dissected free as well just beyond the patch to allow placement of a clamp.  At this time attention was turned towards the right thigh, with the skin incision made and carried down through the skin and subcutaneous tissue.  The saphenous vein was identified and dissected proximally and distally with side branches ligated in continuity and tied with silk suture.  An adequate length was dissected out to allow subsequent replacement of the existing core matrix patch.    5000s of IV heparin was given.  After 3-minute circulation time clamps were placed, and a core matrix patch opened.  Charlesetta Shanks shunt  was inserted distally first, secured with balloon and small Javid clamp.   This was backbled, and allowed inserted in the common carotid artery, secured with balloon and vessel loop occlusion.  Flushing was performed antegrade and retrograde prior to restoring flow.  The core matrix patch was then debrided from the artery in its entirety, with the border of the artery as well to get back to good quality arterial tissue.  The saphenous vein was then tied proximally and distally and divided.  This was brought onto the field, and spatulated open.  Great care was taken to preserve the proper orientation so that the valves would not be an issue.  It was beveled at the distal end, and parachuted to the native internal carotid artery with 6-0 Proline suture.  This was cut for length and configuration and secured proximally with 6-0 suture as well, and the medial anastomotic line completed first.  The shunt was clamped and removed and the lateral anastomotic line completed.  It should be noted an additional 2500 units of heparin had been given at the 1 hour mark.  At this point 50 mg of protamine was given for reversal.  Appropriate flushing was performed prior to completing the anastomotic line in its entirety, and flow restored preferentially to the external carotid.  Surgicel and thrombin was utilized, and repair suture for the medial aspect of the patch, and bleeding was still present in the external carotid were over and over 6-0 Prolene suture was used in a horizontal mattress fashion to obtain closure of this very friable segment.  Simpulse lavage was subsequently carried out of the soft tissue surrounding the artery.  Continuous-wave Doppler was performed demonstrating good flow in the internal and external divisions.  Closure was then performed of the thigh wound, running 3-0 Vicryl suture in 2 layers followed by running 4-0 Monocryl.  Attention was turned towards the neck again, and given the friable and oozing nature, Simpulse was a form once again, with hemostasis obtained with  electrocautery of the raw surfaces, Surgicel and thrombin left in place.  Closure was performed with running 3-0 Vicryl, in 2 separate layers, and subsequently running 4-0 Monocryl.  A #15 Jackson-Pratt drain had been placed on top of the artery and tunneled through the layers prior to the exit site below and lateral to the incision.  This was secured with nylon suture.  Dermabond was used on the thigh, not the neck.  At the time of dictation the patient was just extubated, moving all extremities.  He is to be transferred to the intensive care unit for close monitoring.  All questions were answered.  I was present for all aspects of the above procedure, and no operative complications were apparent.  Learta Coddingarkten Jacoya Bauman, MD FACS [phone]

## 2018-10-14 NOTE — Consult Note (Signed)
Reason for Consult: Pain and swelling right neck, 2 weeks status post right carotid endarterectomy Referring Physician: Dr. Susann GivensSudini  Robert Roy is an 73 y.o. male.  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 Kateri Mcuke.  A core matrix patch was subsequently utilized for repair.  He had an uneventful postoperative course, was discharged postop day 1.  He did fine in the interim, but yesterday developed pain and swelling in the right side of the neck.  He has had no drainage.  He has had no fevers.  He was seen in the emergency room today, has a white count of 14, CT scan of the neck demonstrates abscess in the right neck.  There is no evidence of patch disruption.  It is unclear if this goes down to the level of the patch.  This is more fluid and air that normally would be present at the 2-week mark status post endarterectomy.  He does not have any airway issues.  His pain is minimal, does not require pain medication.  He currently is receiving vancomycin.  There is no accessory muscle use or tracheal deviation.  There is no evidence of loss of integrity of the incision.  I am seeing him in this context.  He remains active, farms, runs an auto shop, walks regularly for golf, no significant claudication type symptoms or lower extremity procedures.  Past Medical History:  Diagnosis Date  . BMI 28.0-28.9,adult   . History of kidney stones    h/o    Past Surgical History:  Procedure Laterality Date  . ENDARTERECTOMY Right 09/28/2018   Procedure: ENDARTERECTOMY CAROTID;  Surgeon: Annice Needyew, Jason S, MD;  Location: [facility] ORS;  Service: Vascular;  Laterality: Right;  . KIDNEY STONE SURGERY      Family History  Problem Relation Age of Onset  . Hypertension Mother   . Heart disease Father   . Prostate cancer Father     Social History:  reports that he has never smoked. He has never used smokeless tobacco. He  reports that he does not drink alcohol or use drugs.  Allergies: No Known Allergies  Medications: I have reviewed the patient's current medications.  These include Lipitor, aspirin, Plavix.  Results for orders placed or performed during the hospital encounter of 10/14/18 (from the past 48 hour(s))  CBC     Status: Abnormal   Collection Time: 10/14/18 10:45 AM  Result Value Ref Range   WBC 14.3 (H) 4.0 - 10.5 K/uL   RBC 5.20 4.22 - 5.81 MIL/uL   Hemoglobin 15.8 13.0 - 17.0 g/dL   HCT 09.847.9 11.939.0 - 14.752.0 %   MCV 92.1 80.0 - 100.0 fL   MCH 30.4 26.0 - 34.0 pg   MCHC 33.0 30.0 - 36.0 g/dL   RDW 82.912.6 56.211.5 - 13.015.5 %   Platelets 254 150 - 400 K/uL   nRBC 0.0 0.0 - 0.2 %    Comment: Performed at [facility], [address] [place], [place] [id number]  Comprehensive metabolic panel     Status: Abnormal   Collection Time: 10/14/18 10:45 AM  Result Value Ref Range   Sodium 140 135 - 145 mmol/L   Potassium 4.4 3.5 - 5.1 mmol/L   Chloride 107 98 - 111 mmol/L   CO2 26 22 - 32 mmol/L   Glucose, Bld 103 (H) 70 - 99 mg/dL   BUN 21 8 - 23  mg/dL   Creatinine, Ser 7.61 (H) 0.61 - 1.24 mg/dL   Calcium 51.8 (H) 8.9 - 10.3 mg/dL   Total Protein 7.8 6.5 - 8.1 g/dL   Albumin 4.1 3.5 - 5.0 g/dL   AST 17 15 - 41 U/L   ALT 15 0 - 44 U/L   Alkaline Phosphatase 91 38 - 126 U/L   Total Bilirubin 1.0 0.3 - 1.2 mg/dL   GFR calc non Af Amer 57 (L) >60 mL/min   GFR calc Af Amer >60 >60 mL/min   Anion gap 7 5 - 15    Comment: Performed at [facility], [address] [place], [place] [address]  APTT     Status: None   Collection Time: 10/14/18 10:45 AM  Result Value Ref Range   aPTT 29 24 - 36 seconds    Comment: Performed at [facility], [address] [place], [place] [address]  Protime-INR     Status: None   Collection Time: 10/14/18 10:45 AM  Result Value Ref Range   Prothrombin Time 13.9 11.4 - 15.2 seconds   INR 1.1 0.8 - 1.2    Comment: (NOTE) INR goal varies  based on device and disease states. Performed at [facility], [address] [place], [place] [address]     Ct Angio Neck W And/or Wo Contrast  Result Date: 10/14/2018 CLINICAL DATA:  Right carotid endarterectomy 09/28/2018. Welling redness and pain EXAM: CT ANGIOGRAPHY NECK TECHNIQUE: Multidetector CT imaging of the neck was performed using the standard protocol during bolus administration of intravenous contrast. Multiplanar CT image reconstructions and MIPs were obtained to evaluate the vascular anatomy. Carotid stenosis measurements (when applicable) are obtained utilizing NASCET criteria, using the distal internal carotid diameter as the denominator. CONTRAST:  39mL OMNIPAQUE IOHEXOL 350 MG/ML SOLN COMPARISON:  CTA neck 08/14/2018 FINDINGS: Aortic arch: Atherosclerotic calcification aortic arch. Negative for dissection or aneurysm. Proximal great vessels patent. Right carotid system: Right carotid endarterectomy. Endarterectomy site is widely patent without significant stenosis. No ulceration or irregularity of the lumen. There is extensive soft tissue swelling in the right neck lateral to the right carotid. 19 mm fluid collection with gas bubble adjacent to the endarterectomy site. Additional edema/fluid in the right sternocleidomastoid muscle from the endarterectomy site extending inferiorly. Considerable subcutaneous edema in the right neck diffusely. Findings likely due to infection. Left carotid system: Left common carotid patent with mild atherosclerotic disease. Approximately 25% stenosis at the origin of the left internal carotid artery. 50% diameter stenosis left external carotid artery origin. Vertebral arteries: Moderate stenosis origin of right vertebral artery. Remainder of the right vertebral artery is patent to the basilar without stenosis Moderate stenosis origin of left vertebral artery. Left vertebral dominant and patent to the basilar without additional stenosis. Skeleton:  Cervical spondylosis. Multilevel disc degeneration and spurring. No acute skeletal abnormality. Other neck: 12 mm nodule posterior to the right thyroid is unchanged and most likely is a thyroid nodule. Right neck swelling and fluid collections as above. Upper chest: Lung apices clear bilaterally. IMPRESSION: 1. Postop right carotid endarterectomy. Extensive soft tissue swelling in the right neck. 19 mm fluid collection with gas lateral to the endarterectomy site, probable abscess immediately adjacent to the right internal carotid artery. Additional fluid collection in the right sternocleidomastoid muscle extending inferiorly may represent additional abscess or phlegmon. The right carotid endarterectomy site is widely patent. 2. 25% diameter stenosis proximal left internal carotid artery 3. Moderate stenosis origin of vertebral artery bilaterally as  noted previously. 4. These results were called by telephone at the time of interpretation on 10/14/2018 at 12:13 pm to Dr. Jene Every , who verbally acknowledged these results. Electronically Signed   By: Marlan Palau M.D.   On: 10/14/2018 12:14    ROS Blood pressure (!) 141/96, pulse 77, temperature 98.2 F (36.8 C), temperature source Oral, resp. rate 16, height  (1.88 m), weight 83.9 kg, SpO2 98 %. Physical Exam   HEENT: Normocephalic atraumatic, sclerae anicteric, extraocular muscles intact.  External ears normal. Neck: Extensive edema right neck, with erythema present, tenderness to palpation.  There is no expressible purulence.  There is no tracheal deviation.  Left side of the neck is unremarkable.  Dermabond still in place on the right. Cardiac: Regular rate and rhythm, no murmur gallop or rub. Pulmonary: No wheezing, no rales. Upper extremities: No edema, excellent radial pulses present. Abdomen: Soft, nontender, no mass, guarding, fullness. Lower extremities: 2+ femoral pulses.  No edema distally.  Assessment/Plan: 1.  Right  postoperative abscess status post carotid endarterectomy: We will plan on exploration, drainage with cultures, possible removal of the patch, replacement of the patch versus primary closure.  Drain placement will be necessitated.  Risks include pain, bleeding, infection, cranial nerve injury, stroke, mortality.  All questions were answered to his satisfaction.  2.  Dyslipidemia: Continue Lipitor 3.  Resumption of antiplatelet therapy when able post procedurally  4.  Appreciate hospitalist assistance with medical management.  Garima Chronis A Citlally Captain 10/14/2018, 1:53 PM

## 2018-10-14 NOTE — Transfer of Care (Signed)
Immediate Anesthesia Transfer of Care Note  Patient: COYT ARNN  Procedure(s) Performed: exploration right neck,drainage abscess,drain placement, carotid patch removal,saphenous vein harvest (Right )  Patient Location: ICU  Anesthesia Type:General  Level of Consciousness: drowsy and patient cooperative  Airway & Oxygen Therapy: Patient Spontanous Breathing and Patient connected to face mask oxygen  Post-op Assessment: Report given to RN and Post -op Vital signs reviewed and stable  Post vital signs: Reviewed and stable  Last Vitals:  Vitals Value Taken Time  BP 168/88 10/14/2018  6:36 PM  Temp    Pulse 68 10/14/2018  6:36 PM  Resp 16 10/14/2018  6:36 PM  SpO2 100 % 10/14/2018  6:36 PM    Last Pain:  Vitals:   10/14/18 1017  TempSrc: Oral  PainSc: 6          Complications: No apparent anesthesia complications

## 2018-10-14 NOTE — H&P (View-Only) (Signed)
Reason for Consult: Pain and swelling right neck, 2 weeks status post right carotid endarterectomy Referring Physician: Dr. Susann GivensSudini  Robert Roy is an 73 y.o. male.  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 Kateri Mcuke.  A core matrix patch was subsequently utilized for repair.  He had an uneventful postoperative course, was discharged postop day 1.  He did fine in the interim, but yesterday developed pain and swelling in the right side of the neck.  He has had no drainage.  He has had no fevers.  He was seen in the emergency room today, has a white count of 14, CT scan of the neck demonstrates abscess in the right neck.  There is no evidence of patch disruption.  It is unclear if this goes down to the level of the patch.  This is more fluid and air that normally would be present at the 2-week mark status post endarterectomy.  He does not have any airway issues.  His pain is minimal, does not require pain medication.  He currently is receiving vancomycin.  There is no accessory muscle use or tracheal deviation.  There is no evidence of loss of integrity of the incision.  I am seeing him in this context.  He remains active, farms, runs an auto shop, walks regularly for golf, no significant claudication type symptoms or lower extremity procedures.  Past Medical History:  Diagnosis Date  . BMI 28.0-28.9,adult   . History of kidney stones    h/o    Past Surgical History:  Procedure Laterality Date  . ENDARTERECTOMY Right 09/28/2018   Procedure: ENDARTERECTOMY CAROTID;  Surgeon: Annice Needyew, Jason S, MD;  Location: [facility] ORS;  Service: Vascular;  Laterality: Right;  . KIDNEY STONE SURGERY      Family History  Problem Relation Age of Onset  . Hypertension Mother   . Heart disease Father   . Prostate cancer Father     Social History:  reports that he has never smoked. He has never used smokeless tobacco. He  reports that he does not drink alcohol or use drugs.  Allergies: No Known Allergies  Medications: I have reviewed the patient's current medications.  These include Lipitor, aspirin, Plavix.  Results for orders placed or performed during the hospital encounter of 10/14/18 (from the past 48 hour(s))  CBC     Status: Abnormal   Collection Time: 10/14/18 10:45 AM  Result Value Ref Range   WBC 14.3 (H) 4.0 - 10.5 K/uL   RBC 5.20 4.22 - 5.81 MIL/uL   Hemoglobin 15.8 13.0 - 17.0 g/dL   HCT 09.847.9 11.939.0 - 14.752.0 %   MCV 92.1 80.0 - 100.0 fL   MCH 30.4 26.0 - 34.0 pg   MCHC 33.0 30.0 - 36.0 g/dL   RDW 82.912.6 56.211.5 - 13.015.5 %   Platelets 254 150 - 400 K/uL   nRBC 0.0 0.0 - 0.2 %    Comment: Performed at [facility], [address] [place], [place] [address]  Comprehensive metabolic panel     Status: Abnormal   Collection Time: 10/14/18 10:45 AM  Result Value Ref Range   Sodium 140 135 - 145 mmol/L   Potassium 4.4 3.5 - 5.1 mmol/L   Chloride 107 98 - 111 mmol/L   CO2 26 22 - 32 mmol/L   Glucose, Bld 103 (H) 70 - 99 mg/dL   BUN 21 8 - 23  mg/dL   Creatinine, Ser 7.61 (H) 0.61 - 1.24 mg/dL   Calcium 51.8 (H) 8.9 - 10.3 mg/dL   Total Protein 7.8 6.5 - 8.1 g/dL   Albumin 4.1 3.5 - 5.0 g/dL   AST 17 15 - 41 U/L   ALT 15 0 - 44 U/L   Alkaline Phosphatase 91 38 - 126 U/L   Total Bilirubin 1.0 0.3 - 1.2 mg/dL   GFR calc non Af Amer 57 (L) >60 mL/min   GFR calc Af Amer >60 >60 mL/min   Anion gap 7 5 - 15    Comment: Performed at [facility], [address] [place], [place] [address]  APTT     Status: None   Collection Time: 10/14/18 10:45 AM  Result Value Ref Range   aPTT 29 24 - 36 seconds    Comment: Performed at [facility], [address] [place], [place] [address]  Protime-INR     Status: None   Collection Time: 10/14/18 10:45 AM  Result Value Ref Range   Prothrombin Time 13.9 11.4 - 15.2 seconds   INR 1.1 0.8 - 1.2    Comment: (NOTE) INR goal varies  based on device and disease states. Performed at [facility], [address] [place], [place] [address]     Ct Angio Neck W And/or Wo Contrast  Result Date: 10/14/2018 CLINICAL DATA:  Right carotid endarterectomy 09/28/2018. Robert Roy redness and pain EXAM: CT ANGIOGRAPHY NECK TECHNIQUE: Multidetector CT imaging of the neck was performed using the standard protocol during bolus administration of intravenous contrast. Multiplanar CT image reconstructions and MIPs were obtained to evaluate the vascular anatomy. Carotid stenosis measurements (when applicable) are obtained utilizing NASCET criteria, using the distal internal carotid diameter as the denominator. CONTRAST:  39mL OMNIPAQUE IOHEXOL 350 MG/ML SOLN COMPARISON:  CTA neck 08/14/2018 FINDINGS: Aortic arch: Atherosclerotic calcification aortic arch. Negative for dissection or aneurysm. Proximal great vessels patent. Right carotid system: Right carotid endarterectomy. Endarterectomy site is widely patent without significant stenosis. No ulceration or irregularity of the lumen. There is extensive soft tissue swelling in the right neck lateral to the right carotid. 19 mm fluid collection with gas bubble adjacent to the endarterectomy site. Additional edema/fluid in the right sternocleidomastoid muscle from the endarterectomy site extending inferiorly. Considerable subcutaneous edema in the right neck diffusely. Findings likely due to infection. Left carotid system: Left common carotid patent with mild atherosclerotic disease. Approximately 25% stenosis at the origin of the left internal carotid artery. 50% diameter stenosis left external carotid artery origin. Vertebral arteries: Moderate stenosis origin of right vertebral artery. Remainder of the right vertebral artery is patent to the basilar without stenosis Moderate stenosis origin of left vertebral artery. Left vertebral dominant and patent to the basilar without additional stenosis. Skeleton:  Cervical spondylosis. Multilevel disc degeneration and spurring. No acute skeletal abnormality. Other neck: 12 mm nodule posterior to the right thyroid is unchanged and most likely is a thyroid nodule. Right neck swelling and fluid collections as above. Upper chest: Lung apices clear bilaterally. IMPRESSION: 1. Postop right carotid endarterectomy. Extensive soft tissue swelling in the right neck. 19 mm fluid collection with gas lateral to the endarterectomy site, probable abscess immediately adjacent to the right internal carotid artery. Additional fluid collection in the right sternocleidomastoid muscle extending inferiorly may represent additional abscess or phlegmon. The right carotid endarterectomy site is widely patent. 2. 25% diameter stenosis proximal left internal carotid artery 3. Moderate stenosis origin of vertebral artery bilaterally as  noted previously. 4. These results were called by telephone at the time of interpretation on 10/14/2018 at 12:13 pm to Dr. ROBERT KINNER , who verbally acknowledged these results. Electronically Signed   By: Charles  Clark M.D.   On: 10/14/2018 12:14    ROS Blood pressure (!) 141/96, pulse 77, temperature 98.2 F (36.8 C), temperature source Oral, resp. rate 16, height 6' 2" (1.88 m), weight 83.9 kg, SpO2 98 %. Physical Exam   HEENT: Normocephalic atraumatic, sclerae anicteric, extraocular muscles intact.  External ears normal. Neck: Extensive edema right neck, with erythema present, tenderness to palpation.  There is no expressible purulence.  There is no tracheal deviation.  Left side of the neck is unremarkable.  Dermabond still in place on the right. Cardiac: Regular rate and rhythm, no murmur gallop or rub. Pulmonary: No wheezing, no rales. Upper extremities: No edema, excellent radial pulses present. Abdomen: Soft, nontender, no mass, guarding, fullness. Lower extremities: 2+ femoral pulses.  No edema distally.  Assessment/Plan: 1.  Right  postoperative abscess status post carotid endarterectomy: We will plan on exploration, drainage with cultures, possible removal of the patch, replacement of the patch versus primary closure.  Drain placement will be necessitated.  Risks include pain, bleeding, infection, cranial nerve injury, stroke, mortality.  All questions were answered to his satisfaction.  2.  Dyslipidemia: Continue Lipitor 3.  Resumption of antiplatelet therapy when able post procedurally  4.  Appreciate hospitalist assistance with medical management.  Vanya Carberry A Neilani Duffee 10/14/2018, 1:53 PM     

## 2018-10-14 NOTE — ED Triage Notes (Signed)
Pt to ED via POV c/o post op problem. Pt had surgery on his carotid on 3/26. Pt states that yesterday he noted that he had swelling at surgical incision. Pt denies drainage. Pt states that the area is sore. Pt denies shortness of breath due to swelling. Pt is in NAD.

## 2018-10-14 NOTE — ED Notes (Signed)
ED TO INPATIENT HANDOFF REPORT  ED Nurse Name and Phone #:  Selena BattenKim [phone]  S Name/Age/Gender Robert GianottiSamuel L Roy 73 y.o. male Room/Bed: ED13A/ED13A  Code Status   Code Status: DNR  Home/SNF/Other Home Patient oriented to: self, place, time and situation Is this baseline? Yes   Triage Complete: Triage complete  Chief Complaint post op complication   Triage Note Pt to ED via POV c/o post op problem. Pt had surgery on his carotid on 3/26. Pt states that yesterday he noted that he had swelling at surgical incision. Pt denies drainage. Pt states that the area is sore. Pt denies shortness of breath due to swelling. Pt is in NAD.    Allergies No Known Allergies  Level of Care/Admitting Diagnosis ED Disposition    ED Disposition Condition Comment   Admit  Hospital Area: [facility] [[id number]]  Level of Care: Med-Surg [16]  Diagnosis: Neck abscess [[id number]][[id number]]  Admitting Physician: Milagros LollSUDINI, SRIKAR [[id number]]  Attending Physician: Milagros LollSUDINI, SRIKAR [[id number]]  Estimated length of stay: past midnight tomorrow  Certification:: I certify this patient will need inpatient services for at least 2 midnights  PT Class (Do Not Modify): Inpatient [101]  PT Acc Code (Do Not Modify): Private [1]       B Medical/Surgery History Past Medical History:  Diagnosis Date  . BMI 28.0-28.9,adult   . History of kidney stones    h/o   Past Surgical History:  Procedure Laterality Date  . ENDARTERECTOMY Right 09/28/2018   Procedure: ENDARTERECTOMY CAROTID;  Surgeon: Annice Needyew, Jason S, MD;  Location: [facility] ORS;  Service: Vascular;  Laterality: Right;  . KIDNEY STONE SURGERY       A IV Location/Drains/Wounds Patient Lines/Drains/Airways Status   Active Line/Drains/Airways    Name:   Placement date:   Placement time:   Site:   Days:   Peripheral IV 09/28/18 Right;Posterior Arm   09/28/18    1046    Arm   16   Peripheral IV 10/14/18 Right Forearm   10/14/18    1047    Forearm   less than 1    Incision (Closed) 09/28/18 Neck Right   09/28/18    1250     16          Intake/Output Last 24 hours No intake or output data in the 24 hours ending 10/14/18 1313  Labs/Imaging Results for orders placed or performed during the hospital encounter of 10/14/18 (from the past 48 hour(s))  CBC     Status: Abnormal   Collection Time: 10/14/18 10:45 AM  Result Value Ref Range   WBC 14.3 (H) 4.0 - 10.5 K/uL   RBC 5.20 4.22 - 5.81 MIL/uL   Hemoglobin 15.8 13.0 - 17.0 g/dL   HCT 46.947.9 62.939.0 - 52.852.0 %   MCV 92.1 80.0 - 100.0 fL   MCH 30.4 26.0 - 34.0 pg   MCHC 33.0 30.0 - 36.0 g/dL   RDW 41.312.6 24.411.5 - 01.015.5 %   Platelets 254 150 - 400 K/uL   nRBC 0.0 0.0 - 0.2 %    Comment: Performed at [facility], [address] [place], [place] [address]  Comprehensive metabolic panel     Status: Abnormal   Collection Time: 10/14/18 10:45 AM  Result Value Ref Range   Sodium 140 135 - 145 mmol/L   Potassium 4.4 3.5 - 5.1 mmol/L   Chloride 107 98 - 111 mmol/L   CO2 26 22 - 32 mmol/L   Glucose, Bld 103 (  H) 70 - 99 mg/dL   BUN 21 8 - 23 mg/dL   Creatinine, Ser 1.61 (H) 0.61 - 1.24 mg/dL   Calcium 09.6 (H) 8.9 - 10.3 mg/dL   Total Protein 7.8 6.5 - 8.1 g/dL   Albumin 4.1 3.5 - 5.0 g/dL   AST 17 15 - 41 U/L   ALT 15 0 - 44 U/L   Alkaline Phosphatase 91 38 - 126 U/L   Total Bilirubin 1.0 0.3 - 1.2 mg/dL   GFR calc non Af Amer 57 (L) >60 mL/min   GFR calc Af Amer >60 >60 mL/min   Anion gap 7 5 - 15    Comment: Performed at [facility], [address] [place], [place] [address]  APTT     Status: None   Collection Time: 10/14/18 10:45 AM  Result Value Ref Range   aPTT 29 24 - 36 seconds    Comment: Performed at [facility], [address] [place], [place] [address]  Protime-INR     Status: None   Collection Time: 10/14/18 10:45 AM  Result Value Ref Range   Prothrombin Time 13.9 11.4 - 15.2 seconds   INR 1.1 0.8 - 1.2    Comment: (NOTE) INR goal varies based on  device and disease states. Performed at [facility], [address] [place], [place] [address]    Ct Angio Neck W And/or Wo Contrast  Result Date: 10/14/2018 CLINICAL DATA:  Right carotid endarterectomy 09/28/2018. [place] redness and pain EXAM: CT ANGIOGRAPHY NECK TECHNIQUE: Multidetector CT imaging of the neck was performed using the standard protocol during bolus administration of intravenous contrast. Multiplanar CT image reconstructions and MIPs were obtained to evaluate the vascular anatomy. Carotid stenosis measurements (when applicable) are obtained utilizing NASCET criteria, using the distal internal carotid diameter as the denominator. CONTRAST:  75mL OMNIPAQUE IOHEXOL 350 MG/ML SOLN COMPARISON:  CTA neck 08/14/2018 FINDINGS: Aortic arch: Atherosclerotic calcification aortic arch. Negative for dissection or aneurysm. Proximal great vessels patent. Right carotid system: Right carotid endarterectomy. Endarterectomy site is widely patent without significant stenosis. No ulceration or irregularity of the lumen. There is extensive soft tissue swelling in the right neck lateral to the right carotid. 19 mm fluid collection with gas bubble adjacent to the endarterectomy site. Additional edema/fluid in the right sternocleidomastoid muscle from the endarterectomy site extending inferiorly. Considerable subcutaneous edema in the right neck diffusely. Findings likely due to infection. Left carotid system: Left common carotid patent with mild atherosclerotic disease. Approximately 25% stenosis at the origin of the left internal carotid artery. 50% diameter stenosis left external carotid artery origin. Vertebral arteries: Moderate stenosis origin of right vertebral artery. Remainder of the right vertebral artery is patent to the basilar without stenosis Moderate stenosis origin of left vertebral artery. Left vertebral dominant and patent to the basilar without additional stenosis. Skeleton: Cervical  spondylosis. Multilevel disc degeneration and spurring. No acute skeletal abnormality. Other neck: 12 mm nodule posterior to the right thyroid is unchanged and most likely is a thyroid nodule. Right neck swelling and fluid collections as above. Upper chest: Lung apices clear bilaterally. IMPRESSION: 1. Postop right carotid endarterectomy. Extensive soft tissue swelling in the right neck. 19 mm fluid collection with gas lateral to the endarterectomy site, probable abscess immediately adjacent to the right internal carotid artery. Additional fluid collection in the right sternocleidomastoid muscle extending inferiorly may represent additional abscess or phlegmon. The right carotid endarterectomy site is widely patent. 2. 25% diameter stenosis proximal left internal  carotid artery 3. Moderate stenosis origin of vertebral artery bilaterally as noted previously. 4. These results were called by telephone at the time of interpretation on 10/14/2018 at 12:13 pm to Dr. Jene Every , who verbally acknowledged these results. Electronically Signed   By: Marlan Palau M.D.   On: 10/14/2018 12:14    Pending Labs Unresulted Labs (From admission, onward)    Start     Ordered   10/15/18 0500  Basic metabolic panel  Tomorrow morning,   STAT     10/14/18 1304   10/15/18 0500  CBC  Tomorrow morning,   STAT     10/14/18 1304          Vitals/Pain Today's Vitals   10/14/18 1017 10/14/18 1018 10/14/18 1242  BP: (!) 153/74  (!) 141/96  Pulse: 81  77  Resp: 16  16  Temp: 98.2 F (36.8 C)    TempSrc: Oral    SpO2: 98%  98%  Weight:  83.9 kg   Height:  6\' 2"  (1.88 m)   PainSc: 6       Isolation Precautions No active isolations  Medications Medications  vancomycin (VANCOCIN) IVPB 1000 mg/200 mL premix (1,000 mg Intravenous New Bag/Given 10/14/18 1310)  hydrALAZINE (APRESOLINE) injection 10 mg (has no administration in time range)  acetaminophen (TYLENOL) tablet 650 mg (has no administration in time range)     Or  acetaminophen (TYLENOL) suppository 650 mg (has no administration in time range)  traMADol (ULTRAM) tablet 50 mg (has no administration in time range)  polyethylene glycol (MIRALAX / GLYCOLAX) packet 17 g (has no administration in time range)  ondansetron (ZOFRAN) tablet 4 mg (has no administration in time range)    Or  ondansetron (ZOFRAN) injection 4 mg (has no administration in time range)  albuterol (PROVENTIL) (2.5 MG/3ML) 0.083% nebulizer solution 2.5 mg (has no administration in time range)  iohexol (OMNIPAQUE) 350 MG/ML injection 75 mL (75 mLs Intravenous Contrast Given 10/14/18 1132)  piperacillin-tazobactam (ZOSYN) IVPB 3.375 g (0 g Intravenous Stopped 10/14/18 1308)    Mobility walks Low fall risk   Focused Assessments    R Recommendations: See Admitting Provider Note  Report given to:   Additional Notes:

## 2018-10-14 NOTE — Anesthesia Procedure Notes (Signed)
Procedure Name: Intubation Date/Time: 10/14/2018 2:45 PM Performed by: Omer Jack, CRNA Pre-anesthesia Checklist: Patient identified, Patient being monitored, Timeout performed, Emergency Drugs available and Suction available Patient Re-evaluated:Patient Re-evaluated prior to induction Oxygen Delivery Method: Circle system utilized Preoxygenation: Pre-oxygenation with 100% oxygen Induction Type: IV induction Ventilation: Mask ventilation without difficulty Laryngoscope Size: 3 and Miller Grade View: Grade I Tube type: Oral Tube size: 7.5 mm Number of attempts: 2 Placement Confirmation: ETT inserted through vocal cords under direct vision,  positive ETCO2 and breath sounds checked- equal and bilateral Secured at: 21 cm Tube secured with: Tape Dental Injury: Teeth and Oropharynx as per pre-operative assessment and Injury to lip  Difficulty Due To: Difficult Airway-  due to edematous airway Comments: Pt trachea deviated to left from swelling right neck due to infected carotid endarterectomy site

## 2018-10-14 NOTE — Progress Notes (Signed)
Advance care  Planning  Purpose of Encounter Neck abscess and CODE STATUS discussion  Parties in Attendance Patient  Patients Decisional capacity Alert and oriented.  Able to make medical decisions.  He would like his wife to be making healthcare decisions if he is unable to.  No ACP documents in place.  Discussed regarding patient's complication from carotid endarterectomy, abscess, treatment plan and prognosis.  All questions answered.  Discussed regarding CODE STATUS and patient tells me that he would not want to be resuscitated or intubated.  Explained DNR/DNI.  Agreed.  Orders entered.  CODE STATUS changed.  DNR/DNI  Time spent - 17 minutes

## 2018-10-14 NOTE — H&P (Signed)
[facility] [facility] - [place] at [facility]   PATIENT NAME: Robert Roy    MR#:  [id number]  DATE OF BIRTH:  [birth year]  DATE OF ADMISSION:  10/14/2018  PRIMARY CARE PHYSICIAN: Simone Curia, MD   REQUESTING/REFERRING PHYSICIAN: Dr. Cyril Loosen  CHIEF COMPLAINT:   Chief Complaint  Patient presents with  . Post-op Problem    HISTORY OF PRESENT ILLNESS:  Robert Roy  is a 73 y.o. male with a known history of right carotid artery stenosis status post endarterectomy on 09/28/2018 presents to the hospital due to redness and swelling around the surgical site since yesterday.  Patient also complains of significant pain on turning his neck.  He has been found to have leukocytosis.  CT scan of the neck shows 2 areas of abscesses.  Afebrile.  Patient is being admitted for IV antibiotics and incision and drainage.  Case has been discussed with Dr. Norma Fredrickson of vascular surgery. Patient has no trouble swallowing or trouble breathing.  No other sites of infection.  No recent antibiotic use.  Not diabetic. He is taking aspirin, Plavix and statin at home.  Has not taken today's medications.  PAST MEDICAL HISTORY:   Past Medical History:  Diagnosis Date  . BMI 28.0-28.9,adult   . History of kidney stones    h/o    PAST SURGICAL HISTORY:   Past Surgical History:  Procedure Laterality Date  . ENDARTERECTOMY Right 09/28/2018   Procedure: ENDARTERECTOMY CAROTID;  Surgeon: Annice Needy, MD;  Location: [facility] ORS;  Service: Vascular;  Laterality: Right;  . KIDNEY STONE SURGERY      SOCIAL HISTORY:   Social History   Tobacco Use  . Smoking status: Never Smoker  . Smokeless tobacco: Never Used  Substance Use Topics  . Alcohol use: Never    Frequency: Never    FAMILY HISTORY:   Family History  Problem Relation Age of Onset  . Hypertension Mother   . Heart disease Father   . Prostate cancer Father     DRUG ALLERGIES:  No Known Allergies  REVIEW OF SYSTEMS:   Review of Systems   Constitutional: Positive for malaise/fatigue. Negative for chills and fever.  HENT: Negative for sore throat.   Eyes: Negative for blurred vision, double vision and pain.  Respiratory: Negative for cough, hemoptysis, shortness of breath and wheezing.   Cardiovascular: Negative for chest pain, palpitations, orthopnea and leg swelling.  Gastrointestinal: Negative for abdominal pain, constipation, diarrhea, heartburn, nausea and vomiting.  Genitourinary: Negative for dysuria and hematuria.  Musculoskeletal: Negative for back pain and joint pain.  Skin: Negative for rash.  Neurological: Negative for sensory change, speech change, focal weakness and headaches.  Endo/Heme/Allergies: Does not bruise/bleed easily.  Psychiatric/Behavioral: Negative for depression. The patient is not nervous/anxious.     MEDICATIONS AT HOME:   Prior to Admission medications   Medication Sig Start Date End Date Taking? Authorizing Provider  acetaminophen (TYLENOL) 325 MG tablet Take 1-2 tablets (325-650 mg total) by mouth every 6 (six) hours as needed for mild pain or moderate pain (or temp >/= 101 F). 09/29/18  Yes Stegmayer, Ranae Plumber, PA-C  aspirin 81 MG EC tablet Take 1 tablet (81 mg total) by mouth daily. 09/30/18  Yes Stegmayer, Cala Bradford A, PA-C  atorvastatin (LIPITOR) 10 MG tablet Take 1 tablet (10 mg total) by mouth at bedtime. 09/29/18  Yes Stegmayer, Ranae Plumber, PA-C  clopidogrel (PLAVIX) 75 MG tablet Take 1 tablet (75 mg total) by mouth daily with breakfast. 09/30/18  Yes  Stegmayer, Ranae Plumber, PA-C     VITAL SIGNS:  Blood pressure (!) 141/96, pulse 77, temperature 98.2 F (36.8 C), temperature source Oral, resp. rate 16, height  (1.88 m), weight 83.9 kg, SpO2 98 %.  PHYSICAL EXAMINATION:  Physical Exam  GENERAL:  73 y.o.-year-old patient lying in the bed with no acute distress.  EYES: Pupils equal, round, reactive to light and accommodation. No scleral icterus. Extraocular muscles intact.   HEENT: Head atraumatic, normocephalic. Oropharynx and nasopharynx clear. No oropharyngeal erythema, moist oral mucosa  NECK:  Supple, no jugular venous distention. No thyroid enlargement  Right neck carotid endarterectomy site with erythema, swelling and redness surrounding it.  No discharge.  Tender and warm.  LUNGS: Normal breath sounds bilaterally, no wheezing, rales, rhonchi. No use of accessory muscles of respiration.  CARDIOVASCULAR: S1, S2 normal. No murmurs, rubs, or gallops.  ABDOMEN: Soft, nontender, nondistended. Bowel sounds present. No organomegaly or mass.  EXTREMITIES: No pedal edema, cyanosis, or clubbing. + 2 pedal & radial pulses b/l.   NEUROLOGIC: Cranial nerves II through XII are intact. No focal Motor or sensory deficits appreciated b/l PSYCHIATRIC: The patient is alert and oriented x 3. Good affect.  SKIN: No obvious rash, lesion, or ulcer.   LABORATORY PANEL:   CBC Recent Labs  Lab 10/14/18 1045  WBC 14.3*  HGB 15.8  HCT 47.9  PLT 254   ------------------------------------------------------------------------------------------------------------------  Chemistries  Recent Labs  Lab 10/14/18 1045  NA 140  K 4.4  CL 107  CO2 26  GLUCOSE 103*  BUN 21  CREATININE 1.26*  CALCIUM 10.9*  AST 17  ALT 15  ALKPHOS 91  BILITOT 1.0   ------------------------------------------------------------------------------------------------------------------  Cardiac Enzymes No results for input(s): TROPONINI in the last 168 hours. ------------------------------------------------------------------------------------------------------------------  RADIOLOGY:  Ct Angio Neck W And/or Wo Contrast  Result Date: 10/14/2018 CLINICAL DATA:  Right carotid endarterectomy 09/28/2018. Welling redness and pain EXAM: CT ANGIOGRAPHY NECK TECHNIQUE: Multidetector CT imaging of the neck was performed using the standard protocol during bolus administration of intravenous contrast.  Multiplanar CT image reconstructions and MIPs were obtained to evaluate the vascular anatomy. Carotid stenosis measurements (when applicable) are obtained utilizing NASCET criteria, using the distal internal carotid diameter as the denominator. CONTRAST:  75mL OMNIPAQUE IOHEXOL 350 MG/ML SOLN COMPARISON:  CTA neck 08/14/2018 FINDINGS: Aortic arch: Atherosclerotic calcification aortic arch. Negative for dissection or aneurysm. Proximal great vessels patent. Right carotid system: Right carotid endarterectomy. Endarterectomy site is widely patent without significant stenosis. No ulceration or irregularity of the lumen. There is extensive soft tissue swelling in the right neck lateral to the right carotid. 19 mm fluid collection with gas bubble adjacent to the endarterectomy site. Additional edema/fluid in the right sternocleidomastoid muscle from the endarterectomy site extending inferiorly. Considerable subcutaneous edema in the right neck diffusely. Findings likely due to infection. Left carotid system: Left common carotid patent with mild atherosclerotic disease. Approximately 25% stenosis at the origin of the left internal carotid artery. 50% diameter stenosis left external carotid artery origin. Vertebral arteries: Moderate stenosis origin of right vertebral artery. Remainder of the right vertebral artery is patent to the basilar without stenosis Moderate stenosis origin of left vertebral artery. Left vertebral dominant and patent to the basilar without additional stenosis. Skeleton: Cervical spondylosis. Multilevel disc degeneration and spurring. No acute skeletal abnormality. Other neck: 12 mm nodule posterior to the right thyroid is unchanged and most likely is a thyroid nodule. Right neck swelling and fluid collections as above. Upper chest: Lung  apices clear bilaterally. IMPRESSION: 1. Postop right carotid endarterectomy. Extensive soft tissue swelling in the right neck. 19 mm fluid collection with gas  lateral to the endarterectomy site, probable abscess immediately adjacent to the right internal carotid artery. Additional fluid collection in the right sternocleidomastoid muscle extending inferiorly may represent additional abscess or phlegmon. The right carotid endarterectomy site is widely patent. 2. 25% diameter stenosis proximal left internal carotid artery 3. Moderate stenosis origin of vertebral artery bilaterally as noted previously. 4. These results were called by telephone at the time of interpretation on 10/14/2018 at 12:13 pm to Dr. Jene EveryOBERT KINNER , who verbally acknowledged these results. Electronically Signed   By: Marlan Palauharles  Clark M.D.   On: 10/14/2018 12:14     IMPRESSION AND PLAN:   *Right carotid endarterectomy site infection with 2 discrete abscesses.  Patient will be admitted for IV antibiotics.  Incision and drainage planned for tomorrow by vascular surgery.  N.p.o. after midnight.  Will start cardiac diet at this time. Pain medications as needed. IV vancomycin and cefepime ordered.  Intraoperative cultures to be followed. Leukocytosis.  Afebrile.  No discharge.  *Carotid artery stenosis.  Recent carotid endarterectomy.  At this time will continue aspirin and statin.  Hold Plavix until vascular surgery evaluates the patient and makes a decision regarding this medication to be continued or hold for surgery.  *CKD stage III.  Stable.  DVT prophylaxis.  SCDs.  No Lovenox or heparin due to surgery planned.  All the records are reviewed and case discussed with ED provider. Management plans discussed with the patient, family and they are in agreement.  CODE STATUS: Full code  TOTAL TIME TAKING CARE OF THIS PATIENT: 40 minutes.   Robert Roy M.D on 10/14/2018 at 1:06 PM  Between 7am to 6pm - Pager - [phone]  After 6pm go to [email or web address] - password [email or web address]  [facility] [place] [facility]  Office  [phone]  CC: Primary care physician; Simone CuriaLee, Keung, MD  Note:  This dictation was prepared with Dragon dictation along with smaller phrase technology. Any transcriptional errors that result from this process are unintentional.

## 2018-10-14 NOTE — ED Notes (Signed)
Pt transported from ED to OR. This RN called ICU to inform and answer handoff questions.

## 2018-10-14 NOTE — Anesthesia Preprocedure Evaluation (Signed)
Anesthesia Evaluation  Patient identified by MRN, date of birth, ID band Patient awake    Reviewed: Allergy & Precautions, NPO status , Patient's Chart, lab work & pertinent test results, reviewed documented beta blocker date and time   Airway Mallampati: III  TM Distance: >3 FB     Dental  (+) Chipped   Pulmonary           Cardiovascular hypertension, Pt. on medications + DOE       Neuro/Psych    GI/Hepatic   Endo/Other    Renal/GU      Musculoskeletal   Abdominal   Peds  Hematology   Anesthesia Other Findings Hx of PVCs. EKG from 1 month ago was normal. ECHO from 67months 60-65.  Reproductive/Obstetrics                             Anesthesia Physical Anesthesia Plan  ASA: III  Anesthesia Plan: General   Post-op Pain Management:    Induction: Intravenous  PONV Risk Score and Plan:   Airway Management Planned: Oral ETT  Additional Equipment:   Intra-op Plan:   Post-operative Plan:   Informed Consent: I have reviewed the patients History and Physical, chart, labs and discussed the procedure including the risks, benefits and alternatives for the proposed anesthesia with the patient or authorized representative who has indicated his/her understanding and acceptance.       Plan Discussed with: CRNA  Anesthesia Plan Comments:         Anesthesia Quick Evaluation

## 2018-10-14 NOTE — Anesthesia Post-op Follow-up Note (Signed)
Anesthesia QCDR form completed.        

## 2018-10-14 NOTE — ED Notes (Signed)
Patient transported to CT 

## 2018-10-14 NOTE — Consult Note (Signed)
Pharmacy Antibiotic Note  Robert Roy is a 73 y.o. male admitted on 10/14/2018 with cellulitis.  Pharmacy has been consulted for vancomycin and cefepime dosing. He is a patient of Dr Driscilla Grammes w/ right carotid artery stenosis status post endarterectomy on 09/28/2018. Incision and drainage planned for tomorrow by vascular surgery. He received 1000 mg in the ED at 1310  Plan:  1) Vancomycin 1750 mg IV Q 24 hrs following an addition 1000 mg dose, Goal AUC 400-550 Expected AUC: 498.3 SCr used: 1.26  2) cefepime 2 grams IV every 8 hours  Height: 6\' 2"  (188 cm) Weight: 185 lb (83.9 kg) IBW/kg (Calculated) : 82.2  Temp (24hrs), Avg:98.2 F (36.8 C), Min:98.2 F (36.8 C), Max:98.2 F (36.8 C)  Recent Labs  Lab 10/14/18 1045  WBC 14.3*  CREATININE 1.26*    Estimated Creatinine Clearance: 63.4 mL/min (A) (by C-G formula based on SCr of 1.26 mg/dL (H)).    No Known Allergies  Antimicrobials this admission: vancomycin 4/11 >>  cefepime 4/11 >>   Microbiology results: No pending or resulting at this time  Thank you for allowing pharmacy to be a part of this patient's care.  Lowella Bandy 10/14/2018 1:26 PM

## 2018-10-15 LAB — BASIC METABOLIC PANEL
Anion gap: 9 (ref 5–15)
BUN: 28 mg/dL — ABNORMAL HIGH (ref 8–23)
CO2: 21 mmol/L — ABNORMAL LOW (ref 22–32)
Calcium: 10.4 mg/dL — ABNORMAL HIGH (ref 8.9–10.3)
Chloride: 107 mmol/L (ref 98–111)
Creatinine, Ser: 1.38 mg/dL — ABNORMAL HIGH (ref 0.61–1.24)
GFR calc Af Amer: 60 mL/min — ABNORMAL LOW (ref >60)
GFR calc non Af Amer: 51 mL/min — ABNORMAL LOW (ref >60)
Glucose, Bld: 135 mg/dL — ABNORMAL HIGH (ref 70–99)
Potassium: 4.5 mmol/L (ref 3.5–5.1)
Sodium: 137 mmol/L (ref 135–145)

## 2018-10-15 LAB — CBC
HCT: 39.4 % (ref 39.0–52.0)
Hemoglobin: 13 g/dL (ref 13.0–17.0)
MCH: 30.5 pg (ref 26.0–34.0)
MCHC: 33 g/dL (ref 30.0–36.0)
MCV: 92.5 fL (ref 80.0–100.0)
Platelets: 217 10*3/uL (ref 150–400)
RBC: 4.26 MIL/uL (ref 4.22–5.81)
RDW: 12.8 % (ref 11.5–15.5)
WBC: 14.5 10*3/uL — ABNORMAL HIGH (ref 4.0–10.5)
nRBC: 0 % (ref 0.0–0.2)

## 2018-10-15 MED ORDER — ASPIRIN EC 81 MG PO TBEC
81.0000 mg | DELAYED_RELEASE_TABLET | Freq: Every day | ORAL | Status: DC
  Administered 2018-10-15: 81 mg via ORAL
  Filled 2018-10-15: qty 1

## 2018-10-15 MED ORDER — SODIUM CHLORIDE 0.9 % IV SOLN
INTRAVENOUS | Status: DC | PRN
  Administered 2018-10-15 – 2018-10-16 (×3): 250 mL via INTRAVENOUS

## 2018-10-15 MED ORDER — ATORVASTATIN CALCIUM 10 MG PO TABS
10.0000 mg | ORAL_TABLET | Freq: Every day | ORAL | Status: DC
  Administered 2018-10-15: 10 mg via ORAL
  Filled 2018-10-15: qty 1

## 2018-10-15 MED ORDER — ASPIRIN EC 81 MG PO TBEC
81.0000 mg | DELAYED_RELEASE_TABLET | Freq: Every day | ORAL | Status: DC
  Administered 2018-10-15 – 2018-10-16 (×2): 81 mg via ORAL
  Filled 2018-10-15 (×2): qty 1

## 2018-10-15 MED ORDER — ATORVASTATIN CALCIUM 10 MG PO TABS: 10.0000 mg | ORAL_TABLET | Freq: Every day | ORAL | Status: DC

## 2018-10-15 NOTE — Progress Notes (Signed)
[employer] - [place] at [facility]   PATIENT NAME: Robert Roy    MR#:  [id number]  DATE OF BIRTH:  10/01/1945  SUBJECTIVE:   Patient states he is doing well this morning.  He is feeling much better than when he first came into the ED.  He denies any fevers or chills.  He endorses mild right-sided neck pain.  REVIEW OF SYSTEMS:  Review of Systems  Constitutional: Negative for chills and fever.  HENT: Negative for congestion and sore throat.   Eyes: Negative for blurred vision and double vision.  Respiratory: Negative for cough.   Cardiovascular: Negative for chest pain and palpitations.  Gastrointestinal: Negative for nausea and vomiting.  Genitourinary: Negative for dysuria and urgency.  Musculoskeletal: Positive for neck pain. Negative for back pain.  Neurological: Negative for dizziness and headaches.  Psychiatric/Behavioral: Negative for depression. The patient is not nervous/anxious.     DRUG ALLERGIES:  No Known Allergies VITALS:  Blood pressure (!) 120/53, pulse 78, temperature 98.3 F (36.8 C), temperature source Oral, resp. rate 16, height 6\' 2"  (1.88 m), weight 84.1 kg, SpO2 97 %. PHYSICAL EXAMINATION:  Physical Exam  GENERAL:  Laying in the bed with no acute distress.  HEENT: Head atraumatic, normocephalic. Pupils equal, round, reactive to light and accommodation. No scleral icterus. Extraocular muscles intact. Oropharynx and nasopharynx clear.  NECK:  Supple, no jugular venous distention. No thyroid enlargement. + Dressing in place over right lateral neck. + Drain in place with serosanguineous drainage. LUNGS: Lungs are clear to auscultation bilaterally. No wheezes, crackles, rhonchi. No use of accessory muscles of respiration.  CARDIOVASCULAR: RRR, S1, S2 normal. No murmurs, rubs, or gallops.  ABDOMEN: Soft, nontender, nondistended. Bowel sounds present.  EXTREMITIES: No pedal edema, cyanosis, or clubbing.  NEUROLOGIC: CN 2-12 intact, no focal  deficits. 5/5 muscle strength throughout all extremities. Sensation intact throughout. Gait not checked.  PSYCHIATRIC: The patient is alert and oriented x 3.  SKIN: No obvious rash, lesion, or ulcer.  LABORATORY PANEL:  Male CBC Recent Labs  Lab 10/15/18 0525  WBC 14.5*  HGB 13.0  HCT 39.4  PLT 217   ------------------------------------------------------------------------------------------------------------------ Chemistries  Recent Labs  Lab 10/14/18 1045 10/15/18 0525  NA 140 137  K 4.4 4.5  CL 107 107  CO2 26 21*  GLUCOSE 103* 135*  BUN 21 28*  CREATININE 1.26* 1.38*  CALCIUM 10.9* 10.4*  MG 2.3  --   AST 17  --   ALT 15  --   ALKPHOS 91  --   BILITOT 1.0  --    RADIOLOGY:  No results found. ASSESSMENT AND PLAN:   Right carotid endarterectomy site infection- improving -s/p evacuation of right cervical abscess, saphenous patch angioplasty, and drain placement 4/11 -Continue IV vancomycin and cefepime -Blood cultures pending -Intraoperative cultures pending  Carotid artery stenosis with recent carotid endarterectomy. -Continue aspirin, Plavix, statin  CKD stage III- stable -Monitor -Avoid nephrotoxic agents  DVT prophylaxis- SCDs  All the records are reviewed and case discussed with Care Management/Social Worker. Management plans discussed with the patient, family and they are in agreement.  CODE STATUS: Full Code  TOTAL TIME TAKING CARE OF THIS PATIENT: 40 minutes.   More than 50% of the time was spent in counseling/coordination of care: YES  POSSIBLE D/C IN 1-2 DAYS, DEPENDING ON CLINICAL CONDITION.   Jinny BlossomKaty D Prakash Kimberling M.D on 10/15/2018 at 3:30 PM  Between 7am to 6pm - Pager - [phone]  After 6pm go to  [email or web address] - Social research officer, government  [facility] [place] [facility]  Office  [phone]  CC: Primary care physician; Simone Curia, MD  Note: This dictation was prepared with Dragon dictation along with smaller phrase technology. Any  transcriptional errors that result from this process are unintentional.

## 2018-10-15 NOTE — Progress Notes (Signed)
Cardene not initiated, BP is not within parameters. Current BP 108/57 HR 85.

## 2018-10-15 NOTE — Progress Notes (Signed)
Subjective: Interval History: none Had pain last night immediately post surgery, but essentially resolved at this time. No speech or swallowing issues. Appetite fair.  Objective: Vital signs in last 24 hours: Temp:  [97.4 F (36.3 C)-98.4 F (36.9 C)] 98.4 F (36.9 C) (04/12 0430) Pulse Rate:  [30-88] 79 (04/12 0630) Resp:  [12-20] 12 (04/12 0630) BP: (99-168)/(57-96) 117/81 (04/12 0630) SpO2:  [92 %-100 %] 96 % (04/12 0630) Weight:  [83.9 kg-85.9 kg] 84.1 kg (04/12 0530)  Intake/Output from previous day: 04/11 0701 - 04/12 0700 In: 1930 [P.O.:480; I.V.:1450] Out: 850 [Urine:400; Drains:50; Blood:400] Intake/Output this shift: No intake/output data recorded.  General appearance: alert, cooperative and no distress Head: Normocephalic, without obvious abnormality, atraumatic Neck: no adenopathy, no JVD, supple, symmetrical, trachea midline and markedly improved swelling, inc c/d/i , drain with sanguinous output, 50 cc by report from nursing Resp: clear to auscultation bilaterally Chest wall: no tenderness Extremities: extremities normal, atraumatic, no cyanosis or edema Incision/Wound: in the leg, without obvious infection  Lab Results: Recent Labs    10/14/18 1045 10/15/18 0525  WBC 14.3* 14.5*  HGB 15.8 13.0  HCT 47.9 39.4  PLT 254 217   BMET Recent Labs    10/14/18 1045 10/15/18 0525  NA 140 137  K 4.4 4.5  CL 107 107  CO2 26 21*  GLUCOSE 103* 135*  BUN 21 28*  CREATININE 1.26* 1.38*  CALCIUM 10.9* 10.4*    Studies/Results: Ct Angio Neck W And/or Wo Contrast  Result Date: 10/14/2018 CLINICAL DATA:  Right carotid endarterectomy 09/28/2018. Welling redness and pain EXAM: CT ANGIOGRAPHY NECK TECHNIQUE: Multidetector CT imaging of the neck was performed using the standard protocol during bolus administration of intravenous contrast. Multiplanar CT image reconstructions and MIPs were obtained to evaluate the vascular anatomy. Carotid stenosis measurements (when  applicable) are obtained utilizing NASCET criteria, using the distal internal carotid diameter as the denominator. CONTRAST:  66mL OMNIPAQUE IOHEXOL 350 MG/ML SOLN COMPARISON:  CTA neck 08/14/2018 FINDINGS: Aortic arch: Atherosclerotic calcification aortic arch. Negative for dissection or aneurysm. Proximal great vessels patent. Right carotid system: Right carotid endarterectomy. Endarterectomy site is widely patent without significant stenosis. No ulceration or irregularity of the lumen. There is extensive soft tissue swelling in the right neck lateral to the right carotid. 19 mm fluid collection with gas bubble adjacent to the endarterectomy site. Additional edema/fluid in the right sternocleidomastoid muscle from the endarterectomy site extending inferiorly. Considerable subcutaneous edema in the right neck diffusely. Findings likely due to infection. Left carotid system: Left common carotid patent with mild atherosclerotic disease. Approximately 25% stenosis at the origin of the left internal carotid artery. 50% diameter stenosis left external carotid artery origin. Vertebral arteries: Moderate stenosis origin of right vertebral artery. Remainder of the right vertebral artery is patent to the basilar without stenosis Moderate stenosis origin of left vertebral artery. Left vertebral dominant and patent to the basilar without additional stenosis. Skeleton: Cervical spondylosis. Multilevel disc degeneration and spurring. No acute skeletal abnormality. Other neck: 12 mm nodule posterior to the right thyroid is unchanged and most likely is a thyroid nodule. Right neck swelling and fluid collections as above. Upper chest: Lung apices clear bilaterally. IMPRESSION: 1. Postop right carotid endarterectomy. Extensive soft tissue swelling in the right neck. 19 mm fluid collection with gas lateral to the endarterectomy site, probable abscess immediately adjacent to the right internal carotid artery. Additional fluid  collection in the right sternocleidomastoid muscle extending inferiorly may represent additional abscess or phlegmon. The right  carotid endarterectomy site is widely patent. 2. 25% diameter stenosis proximal left internal carotid artery 3. Moderate stenosis origin of vertebral artery bilaterally as noted previously. 4. These results were called by telephone at the time of interpretation on 10/14/2018 at 12:13 pm to Dr. Jene EveryOBERT KINNER , who verbally acknowledged these results. Electronically Signed   By: Marlan Palauharles  Clark M.D.   On: 10/14/2018 12:14   Anti-infectives: Anti-infectives (From admission, onward)   Start     Dose/Rate Route Frequency Ordered Stop   10/15/18 0400  vancomycin (VANCOCIN) 1,750 mg in sodium chloride 0.9 % 500 mL IVPB     1,750 mg 250 mL/hr over 120 Minutes Intravenous Every 24 hours 10/14/18 1336     10/14/18 2200  ceFEPIme (MAXIPIME) 2 g in sodium chloride 0.9 % 100 mL IVPB     2 g 200 mL/hr over 30 Minutes Intravenous Every 8 hours 10/14/18 1336     10/14/18 1500  vancomycin (VANCOCIN) IVPB 1000 mg/200 mL premix     1,000 mg 200 mL/hr over 60 Minutes Intravenous  Once 10/14/18 1336     10/14/18 1458  piperacillin-tazobactam (ZOSYN) 3.375 GM/50ML IVPB    Note to Pharmacy:  Eli HoseBuchanan, Leslie   : cabinet override      10/14/18 1458 10/15/18 0259   10/14/18 1215  vancomycin (VANCOCIN) IVPB 1000 mg/200 mL premix     1,000 mg 200 mL/hr over 60 Minutes Intravenous  Once 10/14/18 1214 10/14/18 1410   10/14/18 1215  piperacillin-tazobactam (ZOSYN) IVPB 3.375 g     3.375 g 100 mL/hr over 30 Minutes Intravenous  Once 10/14/18 1214 10/14/18 1308      Assessment/Plan: s/p Procedure(s): exploration right neck,drainage abscess,drain placement, carotid patch removal,saphenous vein harvest (Right) with subsequent GSV carotid patch  Continue ABX therapy due to Post-op infection, cultures pending, MRSA PCR was negative. WBC remains elevated. On vanco and cefipime. Transfer to the  surgical floor, no monitor required, ambulate, diet as tolerated, leave drain, recheck WBC in the am. He looks and subjectively feels much better. All questions answered, discussed with nursing Nehemiah Settle(Brooke) and Dr. Cathie HoopsSymonds.   LOS: 1 day   Dan Dissinger A Remington Skalsky 10/15/2018, 9:06 AM

## 2018-10-15 NOTE — Progress Notes (Signed)
Pt has remained alert and oriented with c/o sore/achiness to right neck incision->repositioned. Pt has remained on RA- SpO2 > 90%- lung sounds clear to auscultation. Pt has remained in NSR on cardiac monitor with frequent multifocal PVCs. Pt has tolerated a modest breakfast this am with no c/o nausea. Condom cath remains. Orders to transfer pt to telemetry.

## 2018-10-15 NOTE — Progress Notes (Signed)
Sent patients valuables with security to be locked up. Contents included the patients wallet with multiple credit cards and $22.00 cash, 2 rings yellow in color, and cell phone.  Locker key and inventory list placed on patients chart. Patient instructed to obtain personal valuables prior to discharge and informed inventory list is on the chart. Patient verbalized understanding.

## 2018-10-16 ENCOUNTER — Encounter: Payer: Self-pay | Admitting: Specialist

## 2018-10-16 LAB — CBC
HCT: 37.8 % — ABNORMAL LOW (ref 39.0–52.0)
Hemoglobin: 12.5 g/dL — ABNORMAL LOW (ref 13.0–17.0)
MCH: 30.3 pg (ref 26.0–34.0)
MCHC: 33.1 g/dL (ref 30.0–36.0)
MCV: 91.5 fL (ref 80.0–100.0)
Platelets: 215 10*3/uL (ref 150–400)
RBC: 4.13 MIL/uL — ABNORMAL LOW (ref 4.22–5.81)
RDW: 12.7 % (ref 11.5–15.5)
WBC: 9.8 10*3/uL (ref 4.0–10.5)
nRBC: 0 % (ref 0.0–0.2)

## 2018-10-16 LAB — BASIC METABOLIC PANEL
Anion gap: 6 (ref 5–15)
BUN: 29 mg/dL — ABNORMAL HIGH (ref 8–23)
CO2: 24 mmol/L (ref 22–32)
Calcium: 10.1 mg/dL (ref 8.9–10.3)
Chloride: 110 mmol/L (ref 98–111)
Creatinine, Ser: 1.37 mg/dL — ABNORMAL HIGH (ref 0.61–1.24)
GFR calc Af Amer: >60 mL/min (ref >60)
GFR calc non Af Amer: 52 mL/min — ABNORMAL LOW (ref >60)
Glucose, Bld: 104 mg/dL — ABNORMAL HIGH (ref 70–99)
Potassium: 4.1 mmol/L (ref 3.5–5.1)
Sodium: 140 mmol/L (ref 135–145)

## 2018-10-16 LAB — GLUCOSE, CAPILLARY: Glucose-Capillary: 134 mg/dL — ABNORMAL HIGH (ref 70–99)

## 2018-10-16 MED ORDER — AMLODIPINE BESYLATE 5 MG PO TABS: 5.0000 mg | ORAL_TABLET | Freq: Every day | ORAL | 0 refills | Status: DC

## 2018-10-16 MED ORDER — DOXYCYCLINE HYCLATE 100 MG PO CAPS: 100.0000 mg | ORAL_CAPSULE | Freq: Two times a day (BID) | ORAL | 0 refills | Status: DC

## 2018-10-16 MED ORDER — AMOXICILLIN-POT CLAVULANATE 875-125 MG PO TABS: 1.0000 | ORAL_TABLET | Freq: Two times a day (BID) | ORAL | 0 refills | Status: DC

## 2018-10-16 NOTE — Discharge Instructions (Addendum)
It was so nice to meet you during this hospitalization!  You came into the hospital with an infection of your surgical site. You had another surgery to clean out this infection. Make sure you keep the surgical wound clean and dry.  I have prescribed the following medications: 1. Take Norvasc daily- this is a new blood pressure medicine because your blood pressures have been high 2. Augmentin 875mg  twice a day- this is an antibiotic. Please take this for 1 week. 3. Doxycycline 100mg  twice a day- this is also an antibiotic that you should take for 1 week.  Please make sure you follow-up with the vascular surgeon in 2 weeks for wound check and repeat ultrasound of your carotid artery.  Take care, Dr. Nancy Marus

## 2018-10-16 NOTE — Discharge Summary (Signed)
[facility] [place] [facility]   PATIENT NAME: Robert LeydenSamuel Roy    MR#:  [id number]  DATE OF BIRTH:  03/04/1946  DATE OF ADMISSION:  10/14/2018   ADMITTING PHYSICIAN: Ezequiel Essexarkten A Pharr, MD  DATE OF DISCHARGE: 10/16/2018  2:30 PM  PRIMARY CARE PHYSICIAN: Simone CuriaLee, Keung, MD   ADMISSION DIAGNOSIS:  Post-operative wound abscess [T81.49XA] Abscess after procedure [T81.49XA] DISCHARGE DIAGNOSIS:  Active Problems:   Neck abscess   Abscess after procedure  SECONDARY DIAGNOSIS:   Past Medical History:  Diagnosis Date  . BMI 28.0-28.9,adult   . History of kidney stones    h/o   HOSPITAL COURSE:   Robert Roy is a 73 year old male with a history of right carotid endarterectomy 2 weeks ago who presented to the ED with worsening swelling, redness, and pain around his surgical incision.  In the ED, he was noted to have leukocytosis.  CT head showed 2 discrete abscesses.  He was admitted for further management.  Right carotid endarterectomy site infection- improved -s/p evacuation of right cervical abscess, saphenous patch angioplasty, and drain placement 4/11 -Drain removed 4/13 by vascular surgery prior to discharge -Wound culture grew few GPC's -Initially placed on vancomycin and cefepime, discharged home on a one-week course of doxycycline and Augmentin -Blood cultures were negative -Patient should follow-up with vascular surgery in 2 weeks for wound check and carotid duplex  Carotid artery stenosis with recent carotid endarterectomy. -Continued aspirin, Plavix, statin  CKD stage III- creatinine stable throughout this hospitalization  Hypertension-patient noted to have high blood pressures this admission -Started on Norvasc daily  DISCHARGE CONDITIONS:  Postoperative infection Carotid artery stenosis s/p carotid endarterectomy CKD 3 Hypertension CONSULTS OBTAINED:  Vascular surgery DRUG ALLERGIES:  No Known Allergies DISCHARGE MEDICATIONS:   Allergies as of  10/16/2018   No Known Allergies     Medication List    TAKE these medications   acetaminophen 325 MG tablet Commonly known as:  TYLENOL Take 1-2 tablets (325-650 mg total) by mouth every 6 (six) hours as needed for mild pain or moderate pain (or temp >/= 101 F).   amLODipine 5 MG tablet Commonly known as:  NORVASC Take 1 tablet (5 mg total) by mouth daily. Start taking on:  October 17, 2018   amoxicillin-clavulanate 875-125 MG tablet Commonly known as:  AUGMENTIN Take 1 tablet by mouth 2 (two) times daily.   aspirin 81 MG EC tablet Take 1 tablet (81 mg total) by mouth daily.   atorvastatin 10 MG tablet Commonly known as:  LIPITOR Take 1 tablet (10 mg total) by mouth at bedtime.   clopidogrel 75 MG tablet Commonly known as:  PLAVIX Take 1 tablet (75 mg total) by mouth daily with breakfast.   doxycycline 100 MG capsule Commonly known as:  VIBRAMYCIN Take 1 capsule (100 mg total) by mouth 2 (two) times daily.        DISCHARGE INSTRUCTIONS:  1.  Follow-up with PCP in 5 days 2.  Follow-up with vascular surgery in 2 weeks for wound check and carotid duplex 3.  Take Augmentin and doxycycline as prescribed for 1 week 4.  Start Norvasc daily for high blood pressure DIET:  Cardiac diet DISCHARGE CONDITION:  Stable ACTIVITY:  Activity as tolerated OXYGEN:  Home Oxygen: No.  Oxygen Delivery: room air DISCHARGE LOCATION:  home   If you experience worsening of your admission symptoms, develop shortness of breath, life threatening emergency, suicidal or homicidal thoughts you must seek medical attention immediately by calling 911  or calling your MD immediately  if symptoms less severe.  You Must read complete instructions/literature along with all the possible adverse reactions/side effects for all the Medicines you take and that have been prescribed to you. Take any new Medicines after you have completely understood and accpet all the possible adverse reactions/side effects.    Please note  You were cared for by a hospitalist during your hospital stay. If you have any questions about your discharge medications or the care you received while you were in the hospital after you are discharged, you can call the unit and asked to speak with the hospitalist on call if the hospitalist that took care of you is not available. Once you are discharged, your primary care physician will handle any further medical issues. Please note that NO REFILLS for any discharge medications will be authorized once you are discharged, as it is imperative that you return to your primary care physician (or establish a relationship with a primary care physician if you do not have one) for your aftercare needs so that they can reassess your need for medications and monitor your lab values.    On the day of Discharge:  VITAL SIGNS:  Blood pressure (!) 142/74, pulse 77, temperature 97.7 F (36.5 C), temperature source Oral, resp. rate 16, height 6\' 2"  (1.88 m), weight 87.3 kg, SpO2 100 %. PHYSICAL EXAMINATION:  GENERAL:  73 y.o.-year-old patient lying in the bed with no acute distress.  EYES: Pupils equal, round, reactive to light and accommodation. No scleral icterus. Extraocular muscles intact.  HEENT: Head atraumatic, normocephalic. Oropharynx and nasopharynx clear.  NECK:  Supple, no jugular venous distention. No thyroid enlargement, no tenderness. + Dry dressing in place over right lateral neck. LUNGS: Normal breath sounds bilaterally, no wheezing, rales,rhonchi or crepitation. No use of accessory muscles of respiration.  CARDIOVASCULAR: S1, S2 normal. No murmurs, rubs, or gallops.  ABDOMEN: Soft, non-tender, non-distended. Bowel sounds present. No organomegaly or mass.  EXTREMITIES: No pedal edema, cyanosis, or clubbing.  NEUROLOGIC: Cranial nerves II through XII are intact. Muscle strength 5/5 in all extremities. Sensation intact. Gait not checked.  PSYCHIATRIC: The patient is alert and  oriented x 3.  SKIN: No obvious rash, lesion, or ulcer.  DATA REVIEW:   CBC Recent Labs  Lab 10/16/18 0338  WBC 9.8  HGB 12.5*  HCT 37.8*  PLT 215    Chemistries  Recent Labs  Lab 10/14/18 1045  10/16/18 0338  NA 140   < > 140  K 4.4   < > 4.1  CL 107   < > 110  CO2 26   < > 24  GLUCOSE 103*   < > 104*  BUN 21   < > 29*  CREATININE 1.26*   < > 1.37*  CALCIUM 10.9*   < > 10.1  MG 2.3  --   --   AST 17  --   --   ALT 15  --   --   ALKPHOS 91  --   --   BILITOT 1.0  --   --    < > = values in this interval not displayed.     Microbiology Results  Results for orders placed or performed during the hospital encounter of 10/14/18  Aerobic/Anaerobic Culture (surgical/deep wound)     Status: None (Preliminary result)   Collection Time: 10/14/18  3:15 PM  Result Value Ref Range Status   Specimen Description   Final    WOUND Performed  at [facility], [address], [place], [place] [address]    Special Requests   Final    NONE Performed at [facility], [address] [place], [place] [address]    Gram Stain   Final    FEW WBC PRESENT,BOTH PMN AND MONONUCLEAR RARE GRAM POSITIVE COCCI IN PAIRS AND CHAINS Performed at [facility], [address], [place], [place] [address]    Culture   Final    CULTURE REINCUBATED FOR BETTER GROWTH NO ANAEROBES ISOLATED; CULTURE IN PROGRESS FOR 5 DAYS    Report Status PENDING  Incomplete  CULTURE, BLOOD (ROUTINE X 2) w Reflex to ID Panel     Status: None (Preliminary result)   Collection Time: 10/14/18  9:02 PM  Result Value Ref Range Status   Specimen Description BLOOD LEFT ANTECUBITAL  Final   Special Requests   Final    BOTTLES DRAWN AEROBIC AND ANAEROBIC Blood Culture adequate volume   Culture   Final    NO GROWTH 2 DAYS Performed at [facility], [address] [place], [place] [address]    Report Status PENDING  Incomplete  CULTURE, BLOOD (ROUTINE X 2) w Reflex to ID Panel      Status: None (Preliminary result)   Collection Time: 10/14/18  9:10 PM  Result Value Ref Range Status   Specimen Description BLOOD BLOOD LEFT HAND  Final   Special Requests   Final    BOTTLES DRAWN AEROBIC AND ANAEROBIC Blood Culture adequate volume   Culture   Final    NO GROWTH 2 DAYS Performed at [facility], [address] [place], [place] [address]    Report Status PENDING  Incomplete    RADIOLOGY:  No results found.   Management plans discussed with the patient, family and they are in agreement.  CODE STATUS: Full Code   TOTAL TIME TAKING CARE OF THIS PATIENT: 45 minutes.    Jinny Blossom Deontez Klinke M.D on 10/16/2018 at 3:00 PM  Between 7am to 6pm - Pager - [phone]  After 6pm go to [email or web address] - Social research officer, government  [facility]  Office  [phone]  CC: Primary care physician; Simone Curia, MD   Note: This dictation was prepared with Dragon dictation along with smaller phrase technology. Any transcriptional errors that result from this process are unintentional.

## 2018-10-16 NOTE — Progress Notes (Signed)
Pt discharged per MD order. IVs removed. Condom catheter removed. Discharge instructions reviewed with pt. Pt verbalized understanding. RN answered all of pts questions regarding his medications upon discharge. Pt taken downstairs in wheelchair by staff.

## 2018-10-16 NOTE — Progress Notes (Signed)
Dressing to neck and groin changed as per md order, no drainage noted, pt tolerated well.

## 2018-10-16 NOTE — Progress Notes (Signed)
[facility]  Daily Progress Note   Subjective  - 2 Days Post-Op  Patient doing well.  No events overnight JP with small amount of output less 24 hours.    Objective Vitals:   10/15/18 1134 10/15/18 1951 10/16/18 0451 10/16/18 0453  BP: (!) 120/53 123/60  124/78  Pulse: 78 74  78  Resp: 16 18  16   Temp: 98.3 F (36.8 C) 98.9 F (37.2 C)  98.2 F (36.8 C)  TempSrc: Oral Oral  Oral  SpO2: 97% 96%  96%  Weight:   87.3 kg   Height:        Intake/Output Summary (Last 24 hours) at 10/16/2018 1130 Last data filed at 10/16/2018 1049 Gross per 24 hour  Intake 247.37 ml  Output 1815 ml  Net -1567.63 ml    PULM  CTAB CV  RRR VASC  Neck with minimal swelling, JP in place. No erythema or drainage.  Laboratory CBC    Component Value Date/Time   WBC 9.8 10/16/2018 0338   HGB 12.5 (L) 10/16/2018 0338   HGB 13.7 08/16/2018 0820   HCT 37.8 (L) 10/16/2018 0338   HCT 40.3 08/16/2018 0820   PLT 215 10/16/2018 0338   PLT 183 08/16/2018 0820    BMET    Component Value Date/Time   NA 140 10/16/2018 0338   NA 142 08/16/2018 0820   K 4.1 10/16/2018 0338   CL 110 10/16/2018 0338   CO2 24 10/16/2018 0338   GLUCOSE 104 (H) 10/16/2018 0338   BUN 29 (H) 10/16/2018 0338   BUN 15 08/16/2018 0820   CREATININE 1.37 (H) 10/16/2018 0338   CALCIUM 10.1 10/16/2018 0338   GFRNONAA 52 (L) 10/16/2018 0338   GFRAA >60 10/16/2018 0338    Assessment/Planning: POD #2 s/p right neck abscess drainage and GSV patch to remove Cormatrix patch   Doing well  JP removed  OK to go home today or tomorrow on oral ABx for another week  Would follow up in office in 10-14 days with duplex    Festus Barren  10/16/2018, 11:30 AM

## 2018-10-17 LAB — SURGICAL PATHOLOGY

## 2018-10-19 LAB — CULTURE, BLOOD (ROUTINE X 2)
Culture: NO GROWTH
Culture: NO GROWTH
Special Requests: ADEQUATE
Special Requests: ADEQUATE

## 2018-10-19 LAB — AEROBIC/ANAEROBIC CULTURE W GRAM STAIN (SURGICAL/DEEP WOUND)

## 2018-10-19 LAB — AEROBIC/ANAEROBIC CULTURE (SURGICAL/DEEP WOUND)

## 2018-10-26 ENCOUNTER — Other Ambulatory Visit (INDEPENDENT_AMBULATORY_CARE_PROVIDER_SITE_OTHER): Payer: Self-pay | Admitting: Vascular Surgery

## 2018-10-26 DIAGNOSIS — Z9889 Other specified postprocedural states: Secondary | ICD-10-CM

## 2018-10-31 ENCOUNTER — Ambulatory Visit (INDEPENDENT_AMBULATORY_CARE_PROVIDER_SITE_OTHER): Payer: Medicare HMO | Admitting: Nurse Practitioner

## 2018-10-31 ENCOUNTER — Other Ambulatory Visit: Payer: Self-pay

## 2018-10-31 ENCOUNTER — Encounter (INDEPENDENT_AMBULATORY_CARE_PROVIDER_SITE_OTHER): Payer: Self-pay | Admitting: Nurse Practitioner

## 2018-10-31 ENCOUNTER — Ambulatory Visit (INDEPENDENT_AMBULATORY_CARE_PROVIDER_SITE_OTHER): Payer: Medicare HMO

## 2018-10-31 VITALS — BP 148/80 | HR 69 | Resp 16 | Wt 192.6 lb

## 2018-10-31 DIAGNOSIS — Z9889 Other specified postprocedural states: Secondary | ICD-10-CM

## 2018-10-31 DIAGNOSIS — T8149XD Infection following a procedure, other surgical site, subsequent encounter: Secondary | ICD-10-CM

## 2018-10-31 DIAGNOSIS — I6522 Occlusion and stenosis of left carotid artery: Secondary | ICD-10-CM

## 2018-10-31 DIAGNOSIS — Z79899 Other long term (current) drug therapy: Secondary | ICD-10-CM

## 2018-10-31 DIAGNOSIS — I6523 Occlusion and stenosis of bilateral carotid arteries: Secondary | ICD-10-CM

## 2018-10-31 DIAGNOSIS — T8149XA Infection following a procedure, other surgical site, initial encounter: Secondary | ICD-10-CM

## 2018-10-31 DIAGNOSIS — I10 Essential (primary) hypertension: Secondary | ICD-10-CM

## 2018-11-02 ENCOUNTER — Encounter (INDEPENDENT_AMBULATORY_CARE_PROVIDER_SITE_OTHER): Payer: Self-pay | Admitting: Nurse Practitioner

## 2018-11-02 NOTE — Progress Notes (Signed)
SUBJECTIVE:  Patient ID: Robert Roy, male    DOB: [birth year], 73 y.o.   MRN: [id number] Chief Complaint  Patient presents with  . Follow-up    [facility] 2week follow up    HPI  Robert Roy is a 73 y.o. male that presents today for follow-up after his carotid endarterectomy on 09/28/2018.  The patient's postoperative course was complicated by infection.  He subsequently had the core matrix removed from the carotid endarterectomy with replacement from his great saphenous vein.  Today his great saphenous vein site is healed and with no issues.  He currently still has a scar on his right neck, however it is clean dry and well approximated.  There is no erythema or purulent drainage present.  He currently denies any fevers, chills, nausea, vomiting or diarrhea.  The patient recently finished his postoperative course of antibiotics.  Currently the patient still endorses having some dizziness at random moments.  He also states that he has some numbness and tingling that comes and goes within his left upper extremity as well as weakness in his left leg.  He states that this comes and goes.  The patient underwent noninvasive studies today which revealed a patent right carotid endarterectomy site.  There was no evidence of fluid collection in the area.  His left carotid artery velocities are consistent with a 40 to 59% stenosis.  Past Medical History:  Diagnosis Date  . BMI 28.0-28.9,adult   . History of kidney stones    h/o    Past Surgical History:  Procedure Laterality Date  . ENDARTERECTOMY Right 09/28/2018   Procedure: ENDARTERECTOMY CAROTID;  Surgeon: Annice Needy, MD;  Location: [facility] ORS;  Service: Vascular;  Laterality: Right;  . I&D EXTREMITY Right 10/14/2018   Procedure: exploration right neck,drainage abscess,drain placement, carotid patch removal,saphenous vein harvest;  Surgeon: Ezequiel Essex, MD;  Location: [facility] ORS;  Service: Vascular;  Laterality: Right;  . KIDNEY STONE SURGERY       Social History   Socioeconomic History  . Marital status: Married    Spouse name: Not on file  . Number of children: Not on file  . Years of education: Not on file  . Highest education level: Not on file  Occupational History  . Not on file  Social Needs  . Financial resource strain: Not on file  . Food insecurity:    Worry: Not on file    Inability: Not on file  . Transportation needs:    Medical: Not on file    Non-medical: Not on file  Tobacco Use  . Smoking status: Never Smoker  . Smokeless tobacco: Never Used  Substance and Sexual Activity  . Alcohol use: Never    Frequency: Never  . Drug use: Never  . Sexual activity: Not on file  Lifestyle  . Physical activity:    Days per week: Not on file    Minutes per session: Not on file  . Stress: Not on file  Relationships  . Social connections:    Talks on phone: Not on file    Gets together: Not on file    Attends religious service: Not on file    Active member of club or organization: Not on file    Attends meetings of clubs or organizations: Not on file    Relationship status: Not on file  . Intimate partner violence:    Fear of current or ex partner: Not on file    Emotionally abused: Not on  file    Physically abused: Not on file    Forced sexual activity: Not on file  Other Topics Concern  . Not on file  Social History Narrative  . Not on file    Family History  Problem Relation Age of Onset  . Hypertension Mother   . Heart disease Father   . Prostate cancer Father     No Known Allergies   Review of Systems   Review of Systems: Negative Unless Checked Constitutional: Weight loss  Fever  Chills Cardiac: Chest pain    Atrial Fibrillation  Palpitations   Shortness of breath when laying flat   Shortness of breath with exertion. Shortness of breath at rest Vascular:  Pain in legs with walking   Pain in legs with standing Pain in legs when laying flat   Claudication     Pain in feet when laying flat    History of DVT   Phlebitis   Swelling in legs   Varicose veins   Non-healing ulcers Pulmonary:   Uses home oxygen   Productive cough   Hemoptysis   Wheeze  COPD   Asthma Neurologic:  Dizziness   Seizures  Blackouts History of stroke   History of TIA  Aphasia   Temporary Blindness   Weakness or numbness in arm   Weakness or numbness in leg Musculoskeletal:   Joint swelling   Joint pain   Low back pain   History of Knee Replacement Arthritis back Surgeries   Spinal Stenosis    Hematologic:  Easy bruising  Easy bleeding   Hypercoagulable state   Anemic Gastrointestinal:  Diarrhea   Vomiting  Gastroesophageal reflux/heartburn   Difficulty swallowing. Abdominal pain Genitourinary:  Chronic kidney disease   Difficult urination  Anuric   Blood in urine Frequent urination  Burning with urination   Hematuria Skin:  Rashes   Ulcers Wounds Psychological:  History of anxiety    History of major depression   Memory Difficulties      OBJECTIVE:   Physical Exam  BP (!) 148/80 (BP Location: Right Arm)   Pulse 69   Resp 16   Wt 192 lb 9.6 oz (87.4 kg)   BMI 24.73 kg/m   Gen: WD/WN, NAD Head: [place]/AT, No temporalis wasting.  Ear/Nose/Throat: Hearing grossly intact, nares w/o erythema or drainage Eyes: PER, EOMI, sclera nonicteric.  Neck: Supple, no masses.  No JVD.  Pulmonary:  Good air movement, no use of accessory muscles.  Cardiac: RRR Vascular:  Well approximated carotid scar no evidence of infection. Vessel Right Left  Radial Palpable Palpable   Gastrointestinal: soft, non-distended. No guarding/no peritoneal signs.  Musculoskeletal: M/S 5/5 throughout.  No deformity or atrophy.  Neurologic: Pain and light touch intact in extremities.  Symmetrical.  Speech is fluent. Motor exam as listed above. Psychiatric: Judgment intact, Mood & affect appropriate for pt's  clinical situation. Dermatologic: No Venous rashes. No Ulcers Noted.  No changes consistent with cellulitis. Lymph : No Cervical lymphadenopathy, no lichenification or skin changes of chronic lymphedema.       ASSESSMENT AND PLAN:  1. Bilateral carotid artery stenosis Recommend:  The patient is s/p successful right CEA.  His postoperative course was complicated by infection of the core matrix.  The patient is still having some residual dizziness, weakness of his left leg and numbness of his left hand.  Duplex ultrasound preoperatively shows 40-59% contralateral stenosis.  Continue antiplatelet therapy as prescribed Continue management of CAD, HTN and Hyperlipidemia Healthy heart diet,  encouraged exercise at least 4 times per week  Follow up in 6 weeks, with no study, to assess his neurological changes to see if these resolve themselves or if we may need to place a referral to neurology or ENT.  - VAS US CAROTID; Future  2. Essential hypertension Continue antihypertensive medications as already ordered, these medications have been reviewed and there are no changes at this time.   3. Abscess after procedure Currently this is resolved.  No evidence of any fluid collections or pockets on ultrasound.  Advised patient that should he develop any fevers, chills, nausea, vomiting or purulent drainage from the site he should let us know soon as possible.   Current Outpatient Medications on File Prior to Visit  Medication Sig Dispense Refill  . acetaminophen (TYLENOL) 325 MG tablet Take 1-2 tablets (325-650 mg total) by mouth every 6 (six) hours as needed for mild pain or moderate pain (or temp >/= 101 F).    [name]. amLODipine (NORVASC) 5 MG tablet Take 1 tablet (5 mg total) by mouth daily. 30 tablet 0  . aspirin 81 MG EC tablet Take 1 tablet (81 mg total) by mouth daily. 90 tablet 3  . atorvastatin (LIPITOR) 10 MG tablet Take 1 tablet (10 mg total) by mouth at bedtime. 90 tablet 3  .  clopidogrel (PLAVIX) 75 MG tablet Take 1 tablet (75 mg total) by mouth daily with breakfast. 90 tablet 3  . amoxicillin-clavulanate (AUGMENTIN) 875-125 MG tablet Take 1 tablet by mouth 2 (two) times daily. (Patient not taking: Reported on 10/31/2018) 14 tablet 0  . doxycycline (VIBRAMYCIN) 100 MG capsule Take 1 capsule (100 mg total) by mouth 2 (two) times daily. (Patient not taking: Reported on 10/31/2018) 14 capsule 0   No current facility-administered medications on file prior to visit.     There are no Patient Instructions on file for this visit. No follow-ups on file.   Georgiana SpinnerFallon E Brown, NP  This note was completed with Office managerDragon Dictation.  Any errors are purely unintentional.

## 2018-11-23 DIAGNOSIS — R030 Elevated blood-pressure reading, without diagnosis of hypertension: Secondary | ICD-10-CM | POA: Diagnosis not present

## 2018-11-23 DIAGNOSIS — J309 Allergic rhinitis, unspecified: Secondary | ICD-10-CM | POA: Diagnosis not present

## 2018-11-23 DIAGNOSIS — I6521 Occlusion and stenosis of right carotid artery: Secondary | ICD-10-CM | POA: Diagnosis not present

## 2018-11-23 DIAGNOSIS — N400 Enlarged prostate without lower urinary tract symptoms: Secondary | ICD-10-CM | POA: Diagnosis not present

## 2018-11-23 DIAGNOSIS — Z6826 Body mass index (BMI) 26.0-26.9, adult: Secondary | ICD-10-CM | POA: Diagnosis not present

## 2018-11-23 DIAGNOSIS — I493 Ventricular premature depolarization: Secondary | ICD-10-CM | POA: Diagnosis not present

## 2018-11-23 DIAGNOSIS — E663 Overweight: Secondary | ICD-10-CM | POA: Diagnosis not present

## 2018-11-23 DIAGNOSIS — E785 Hyperlipidemia, unspecified: Secondary | ICD-10-CM | POA: Diagnosis not present

## 2018-11-23 DIAGNOSIS — R42 Dizziness and giddiness: Secondary | ICD-10-CM | POA: Diagnosis not present

## 2018-12-08 DIAGNOSIS — J309 Allergic rhinitis, unspecified: Secondary | ICD-10-CM | POA: Diagnosis not present

## 2018-12-08 DIAGNOSIS — E663 Overweight: Secondary | ICD-10-CM | POA: Diagnosis not present

## 2018-12-08 DIAGNOSIS — I493 Ventricular premature depolarization: Secondary | ICD-10-CM | POA: Diagnosis not present

## 2018-12-08 DIAGNOSIS — I10 Essential (primary) hypertension: Secondary | ICD-10-CM | POA: Diagnosis not present

## 2018-12-08 DIAGNOSIS — R42 Dizziness and giddiness: Secondary | ICD-10-CM | POA: Diagnosis not present

## 2018-12-08 DIAGNOSIS — Z6825 Body mass index (BMI) 25.0-25.9, adult: Secondary | ICD-10-CM | POA: Diagnosis not present

## 2018-12-08 DIAGNOSIS — I6521 Occlusion and stenosis of right carotid artery: Secondary | ICD-10-CM | POA: Diagnosis not present

## 2018-12-08 DIAGNOSIS — N400 Enlarged prostate without lower urinary tract symptoms: Secondary | ICD-10-CM | POA: Diagnosis not present

## 2018-12-12 ENCOUNTER — Encounter (INDEPENDENT_AMBULATORY_CARE_PROVIDER_SITE_OTHER): Payer: Self-pay | Admitting: Vascular Surgery

## 2018-12-12 ENCOUNTER — Other Ambulatory Visit: Payer: Self-pay

## 2018-12-12 ENCOUNTER — Ambulatory Visit (INDEPENDENT_AMBULATORY_CARE_PROVIDER_SITE_OTHER): Payer: Medicare HMO | Admitting: Vascular Surgery

## 2018-12-12 VITALS — BP 154/84 | HR 69 | Resp 16 | Wt 196.6 lb

## 2018-12-12 DIAGNOSIS — I10 Essential (primary) hypertension: Secondary | ICD-10-CM

## 2018-12-12 DIAGNOSIS — Z79899 Other long term (current) drug therapy: Secondary | ICD-10-CM

## 2018-12-12 DIAGNOSIS — T8149XA Infection following a procedure, other surgical site, initial encounter: Secondary | ICD-10-CM

## 2018-12-12 DIAGNOSIS — Z7902 Long term (current) use of antithrombotics/antiplatelets: Secondary | ICD-10-CM

## 2018-12-12 DIAGNOSIS — I6521 Occlusion and stenosis of right carotid artery: Secondary | ICD-10-CM

## 2018-12-12 DIAGNOSIS — I951 Orthostatic hypotension: Secondary | ICD-10-CM

## 2018-12-12 DIAGNOSIS — Z9889 Other specified postprocedural states: Secondary | ICD-10-CM

## 2018-12-15 ENCOUNTER — Encounter (INDEPENDENT_AMBULATORY_CARE_PROVIDER_SITE_OTHER): Payer: Self-pay | Admitting: Vascular Surgery

## 2018-12-15 NOTE — Progress Notes (Signed)
SUBJECTIVE:  Patient ID: Robert Roy, male    DOB: 1946/07/02, 73 y.o.   MRN: [id number] Chief Complaint  Patient presents with   Follow-up    6week follow up    HPI  NYCHOLAS Roy is a 73 y.o. male that presents for follow-up following carotid endarterectomy.  The patient's postoperative course was complicated by a abscess that formed following surgery.  At this time the abscess is resolved and there is no other evidence of infection or pain.  Previously, the patient had continued weakness of his left upper and lower extremities however at this time this is resolved.  The patient's only complaint today is dizziness that happens when he moves from sitting to standing in a rapid fashion.  Otherwise he has no complaints.  He denies any fever, chills, nausea, vomiting or diarrhea.  Past Medical History:  Diagnosis Date   BMI 28.0-28.9,adult    History of kidney stones    h/o    Past Surgical History:  Procedure Laterality Date   ENDARTERECTOMY Right 09/28/2018   Procedure: ENDARTERECTOMY CAROTID;  Surgeon: Algernon Huxley, MD;  Location: [facility];  Service: Vascular;  Laterality: Right;   I&D EXTREMITY Right 10/14/2018   Procedure: exploration right neck,drainage abscess,drain placement, carotid patch removal,saphenous vein harvest;  Surgeon: Shelda Altes, MD;  Location: [facility];  Service: Vascular;  Laterality: Right;   KIDNEY STONE SURGERY      Social History   Socioeconomic History   Marital status: Married    Spouse name: Not on file   Number of children: Not on file   Years of education: Not on file   Highest education level: Not on file  Occupational History   Not on file  Social Needs   Financial resource strain: Not on file   Food insecurity    Worry: Not on file    Inability: Not on file   Transportation needs    Medical: Not on file    Non-medical: Not on file  Tobacco Use   Smoking status: Never Smoker   Smokeless tobacco: Never Used    Substance and Sexual Activity   Alcohol use: Never    Frequency: Never   Drug use: Never   Sexual activity: Not on file  Lifestyle   Physical activity    Days per week: Not on file    Minutes per session: Not on file   Stress: Not on file  Relationships   Social connections    Talks on phone: Not on file    Gets together: Not on file    Attends religious service: Not on file    Active member of club or organization: Not on file    Attends meetings of clubs or organizations: Not on file    Relationship status: Not on file   Intimate partner violence    Fear of current or ex partner: Not on file    Emotionally abused: Not on file    Physically abused: Not on file    Forced sexual activity: Not on file  Other Topics Concern   Not on file  Social History Narrative   Not on file    Family History  Problem Relation Age of Onset   Hypertension Mother    Heart disease Father    Prostate cancer Father     No Known Allergies   Review of Systems   Review of Systems: Negative Unless Checked Constitutional: [] Weight loss  [] Fever  [] Chills Cardiac: [] Chest  pain   []  Atrial Fibrillation  [] Palpitations   [] Shortness of breath when laying flat   [] Shortness of breath with exertion. [] Shortness of breath at rest Vascular:  [] Pain in legs with walking   [] Pain in legs with standing [] Pain in legs when laying flat   [] Claudication    [] Pain in feet when laying flat    [] History of DVT   [] Phlebitis   [] Swelling in legs   [] Varicose veins   [] Non-healing ulcers Pulmonary:   [] Uses home oxygen   [] Productive cough   [] Hemoptysis   [] Wheeze  [] COPD   [] Asthma Neurologic:  [x] Dizziness   [] Seizures  [] Blackouts [] History of stroke   [] History of TIA  [] Aphasia   [] Temporary Blindness   [] Weakness or numbness in arm   [] Weakness or numbness in leg Musculoskeletal:   [] Joint swelling   [] Joint pain   [] Low back pain  []  History of Knee Replacement [] Arthritis [] back Surgeries  []   Spinal Stenosis    Hematologic:  [] Easy bruising  [] Easy bleeding   [] Hypercoagulable state   [] Anemic Gastrointestinal:  [] Diarrhea   [] Vomiting  [] Gastroesophageal reflux/heartburn   [] Difficulty swallowing. [] Abdominal pain Genitourinary:  [] Chronic kidney disease   [] Difficult urination  [] Anuric   [] Blood in urine [] Frequent urination  [] Burning with urination   [] Hematuria Skin:  [] Rashes   [] Ulcers [] Wounds Psychological:  [] History of anxiety   []  History of major depression  [x]  Memory Difficulties      OBJECTIVE:   Physical Exam  BP (!) 154/84 (BP Location: Right Arm)    Pulse 69    Resp 16    Wt 196 lb 9.6 oz (89.2 kg)    BMI 25.24 kg/m   Gen: WD/WN, NAD Head: [place]/AT, No temporalis wasting.  Ear/Nose/Throat: Hearing grossly intact, nares w/o erythema or drainage Eyes: PER, EOMI, sclera nonicteric.  Neck: Supple, no masses.  No JVD.  Pulmonary:  Good air movement, no use of accessory muscles.  Cardiac: RRR Vascular:  Vessel Right Left  Radial Palpable Palpable   Gastrointestinal: soft, non-distended. No guarding/no peritoneal signs.  Musculoskeletal: M/S 5/5 throughout.  No deformity or atrophy.  Neurologic: Pain and light touch intact in extremities.  Symmetrical.  Speech is fluent. Motor exam as listed above. Psychiatric: Judgment intact, Mood & affect appropriate for pt's clinical situation. Dermatologic: No Venous rashes. No Ulcers Noted.  No changes consistent with cellulitis. Lymph : No Cervical lymphadenopathy, no lichenification or skin changes of chronic lymphedema.       ASSESSMENT AND PLAN:  1. Carotid stenosis, right Recommend:  The patient is s/p successful right  CEA   Continue antiplatelet therapy as prescribed Continue management of CAD, HTN and Hyperlipidemia Healthy heart diet,  encouraged exercise at least 4 times per week  Follow up in 6 months with duplex ultrasound and physical exam based on the patient's carotid surgery   - VAS US  CAROTID; Future  2. Abscess after procedure Currently resolved, previous carotid ultrasound showed no evidence of abscess.  Patient currently showing no signs symptoms of infection today.  3. Essential hypertension Continue antihypertensive medications as already ordered, these medications have been reviewed and there are no changes at this time.   4. Orthostatic hypotension Orthostatic blood pressures today reveal that the patient has orthostatic hypotension likely contributing to his dizziness.  Patient advised to follow-up with primary care physician in order to determine if adjustments to blood pressure medications may be necessary to prevent worsening of dizziness.   Current Outpatient Medications  on File Prior to Visit  Medication Sig Dispense Refill   acetaminophen (TYLENOL) 325 MG tablet Take 1-2 tablets (325-650 mg total) by mouth every 6 (six) hours as needed for mild pain or moderate pain (or temp >/= 101 F).     amLODipine (NORVASC) 5 MG tablet Take 1 tablet (5 mg total) by mouth daily. 30 tablet 0   aspirin 81 MG EC tablet Take 1 tablet (81 mg total) by mouth daily. 90 tablet 3   atorvastatin (LIPITOR) 10 MG tablet Take 1 tablet (10 mg total) by mouth at bedtime. 90 tablet 3   clopidogrel (PLAVIX) 75 MG tablet Take 1 tablet (75 mg total) by mouth daily with breakfast. 90 tablet 3   amoxicillin-clavulanate (AUGMENTIN) 875-125 MG tablet Take 1 tablet by mouth 2 (two) times daily. (Patient not taking: Reported on 10/31/2018) 14 tablet 0   doxycycline (VIBRAMYCIN) 100 MG capsule Take 1 capsule (100 mg total) by mouth 2 (two) times daily. (Patient not taking: Reported on 10/31/2018) 14 capsule 0   No current facility-administered medications on file prior to visit.     There are no Patient Instructions on file for this visit. No follow-ups on file.   Georgiana SpinnerFallon E Nayleen Janosik, NP  This note was completed with Office managerDragon Dictation.  Any errors are purely unintentional.

## 2018-12-22 DIAGNOSIS — I493 Ventricular premature depolarization: Secondary | ICD-10-CM | POA: Diagnosis not present

## 2018-12-22 DIAGNOSIS — I10 Essential (primary) hypertension: Secondary | ICD-10-CM | POA: Diagnosis not present

## 2018-12-22 DIAGNOSIS — N400 Enlarged prostate without lower urinary tract symptoms: Secondary | ICD-10-CM | POA: Diagnosis not present

## 2018-12-22 DIAGNOSIS — Z1331 Encounter for screening for depression: Secondary | ICD-10-CM | POA: Diagnosis not present

## 2018-12-22 DIAGNOSIS — I6521 Occlusion and stenosis of right carotid artery: Secondary | ICD-10-CM | POA: Diagnosis not present

## 2018-12-22 DIAGNOSIS — Z9181 History of falling: Secondary | ICD-10-CM | POA: Diagnosis not present

## 2018-12-22 DIAGNOSIS — R42 Dizziness and giddiness: Secondary | ICD-10-CM | POA: Diagnosis not present

## 2018-12-22 DIAGNOSIS — J309 Allergic rhinitis, unspecified: Secondary | ICD-10-CM | POA: Diagnosis not present

## 2018-12-22 DIAGNOSIS — E785 Hyperlipidemia, unspecified: Secondary | ICD-10-CM | POA: Diagnosis not present

## 2019-01-03 ENCOUNTER — Encounter: Payer: Self-pay | Admitting: Cardiology

## 2019-01-03 ENCOUNTER — Ambulatory Visit (INDEPENDENT_AMBULATORY_CARE_PROVIDER_SITE_OTHER): Payer: Medicare HMO | Admitting: Cardiology

## 2019-01-03 ENCOUNTER — Other Ambulatory Visit: Payer: Self-pay

## 2019-01-03 VITALS — BP 132/72 | HR 72 | Ht 74.0 in | Wt 196.0 lb

## 2019-01-03 DIAGNOSIS — Z1329 Encounter for screening for other suspected endocrine disorder: Secondary | ICD-10-CM | POA: Diagnosis not present

## 2019-01-03 DIAGNOSIS — E782 Mixed hyperlipidemia: Secondary | ICD-10-CM

## 2019-01-03 DIAGNOSIS — I7090 Unspecified atherosclerosis: Secondary | ICD-10-CM

## 2019-01-03 DIAGNOSIS — Z9889 Other specified postprocedural states: Secondary | ICD-10-CM

## 2019-01-03 HISTORY — DX: Mixed hyperlipidemia: E78.2

## 2019-01-03 HISTORY — DX: Other specified postprocedural states: Z98.890

## 2019-01-03 HISTORY — DX: Unspecified atherosclerosis: I70.90

## 2019-01-03 NOTE — Patient Instructions (Addendum)
Medication Instructions:  Your physician recommends that you continue on your current medications as directed. Please refer to the Current Medication list given to you today.  If you need a refill on your cardiac medications before your next appointment, please call your pharmacy.   Lab work: Your physician recommends that you return FASTING for BMP, CBC, TSH, liver and lipid panel.  If you have labs (blood work) drawn today and your tests are completely normal, you will receive your results only by: [name] MyChart Message (if you have MyChart) OR . A paper copy in the mail If you have any lab test that is abnormal or we need to change your treatment, we will call you to review the results.  Testing/Procedures:  NONE  Follow-Up: At [facility], you and your health needs are our priority.  As part of our continuing mission to provide you with exceptional heart care, we have created designated Provider Care Teams.  These Care Teams include your primary Cardiologist (physician) and Advanced Practice Providers (APPs -  Physician Assistants and Nurse Practitioners) who all work together to provide you with the care you need, when you need it. You will need a follow up appointment in 2 months.

## 2019-01-03 NOTE — Progress Notes (Signed)
Cardiology Office Note:    Date:  01/03/2019   ID:  [name], DOB 1946-03-05, MRN [id number]  PCP:  Robert Nakai, MD  Cardiologist:  Robert Lindau, MD   Referring MD: Robert Nakai, MD    ASSESSMENT:    1. Atherosclerotic vascular disease   2. Status post carotid endarterectomy   3. Mixed dyslipidemia    PLAN:    In order of problems listed above:  1. Atherosclerotic vascular disease post right carotid endarterectomy.  The patient seems to be doing fine.  Surgical wound has healed well. 2. Essential hypertension: I think lifestyle modification has been emphasized and he is without medications at this time and doing fine.  His blood pressure stable.  He will keep a track of it.  I encouraged him to keep himself well-hydrated in view of some dizziness at times. 3. Mixed dyslipidemia: He is on statin therapy.  He will be back in the next few days for a Chem-7 CBC TSH and liver lipid check. 4. Mild renal insufficiency on recent testing and this will be followed on the blood work mentioned above 5. Patient will be seen in follow-up appointment in 2 months or earlier if the patient has any concerns    Medication Adjustments/Labs and Tests Ordered: Current medicines are reviewed at length with the patient today.  Concerns regarding medicines are outlined above.  No orders of the defined types were placed in this encounter.  No orders of the defined types were placed in this encounter.    No chief complaint on file.    History of Present Illness:    Robert Roy is a 73 y.o. male.  He has past medical history of carotid artery disease.  He is post endarterectomy.  Subsequently he developed an abscess and got treated.  He is doing fine now he mentions to me that he has some dizziness at times.  Never had a fall or any such symptoms.  He is on dual antiplatelet therapy.  He also has mixed dyslipidemia and is on statin therapy.  At the time of my evaluation, the patient is alert  awake oriented and in no distress.  Past Medical History:  Diagnosis Date   BMI 28.0-28.9,adult    History of kidney stones    h/o    Past Surgical History:  Procedure Laterality Date   ENDARTERECTOMY Right 09/28/2018   Procedure: ENDARTERECTOMY CAROTID;  Surgeon: Robert Huxley, MD;  Location: [facility];  Service: Vascular;  Laterality: Right;   I&D EXTREMITY Right 10/14/2018   Procedure: exploration right neck,drainage abscess,drain placement, carotid patch removal,saphenous vein harvest;  Surgeon: Robert Altes, MD;  Location: [facility];  Service: Vascular;  Laterality: Right;   KIDNEY STONE SURGERY      Current Medications: Current Meds  Medication Sig   aspirin 81 MG EC tablet Take 1 tablet (81 mg total) by mouth daily.   atorvastatin (LIPITOR) 10 MG tablet Take 1 tablet (10 mg total) by mouth at bedtime.   clopidogrel (PLAVIX) 75 MG tablet Take 1 tablet (75 mg total) by mouth daily with breakfast.   tamsulosin (FLOMAX) 0.4 MG CAPS capsule Take 1 capsule by mouth daily.     Allergies:   Patient has no known allergies.   Social History   Socioeconomic History   Marital status: Married    Spouse name: Not on file   Number of children: Not on file   Years of education: Not on file   Highest  education level: Not on file  Occupational History   Not on file  Social Needs   Financial resource strain: Not on file   Food insecurity    Worry: Not on file    Inability: Not on file   Transportation needs    Medical: Not on file    Non-medical: Not on file  Tobacco Use   Smoking status: Never Smoker   Smokeless tobacco: Never Used  Substance and Sexual Activity   Alcohol use: Never    Frequency: Never   Drug use: Never   Sexual activity: Not on file  Lifestyle   Physical activity    Days per week: Not on file    Minutes per session: Not on file   Stress: Not on file  Relationships   Social connections    Talks on phone: Not on file    Gets  together: Not on file    Attends religious service: Not on file    Active member of club or organization: Not on file    Attends meetings of clubs or organizations: Not on file    Relationship status: Not on file  Other Topics Concern   Not on file  Social History Narrative   Not on file     Family History: The patient's family history includes Heart disease in his father; Hypertension in his mother; Prostate cancer in his father.  ROS:   Please see the history of present illness.    All other systems reviewed and are negative.  EKGs/Labs/Other Studies Reviewed:    The following studies were reviewed today: I discussed lab work with the patient at length.   Recent Labs: 08/16/2018: TSH 6.140 10/14/2018: ALT 15; Magnesium 2.3 10/16/2018: BUN 29; Creatinine, Ser 1.37; Hemoglobin 12.5; Platelets 215; Potassium 4.1; Sodium 140  Recent Lipid Panel    Component Value Date/Time   CHOL 154 08/16/2018 0820   TRIG 55 08/16/2018 0820   HDL 44 08/16/2018 0820   CHOLHDL 3.5 08/16/2018 0820   LDLCALC 99 08/16/2018 0820    Physical Exam:    VS:  BP 132/72 (BP Location: Left Arm, Patient Position: Sitting, Cuff Size: Normal)    Pulse 72    Ht 6\' 2"  (1.88 m)    Wt 196 lb (88.9 kg)    SpO2 98%    BMI 25.16 kg/m     Wt Readings from Last 3 Encounters:  01/03/19 196 lb (88.9 kg)  12/12/18 196 lb 9.6 oz (89.2 kg)  10/31/18 192 lb 9.6 oz (87.4 kg)     GEN: Patient is in no acute distress HEENT: Normal NECK: No JVD; No carotid bruits LYMPHATICS: No lymphadenopathy CARDIAC: Hear sounds regular, 2/6 systolic murmur at the apex. RESPIRATORY:  Clear to auscultation without rales, wheezing or rhonchi  ABDOMEN: Soft, non-tender, non-distended MUSCULOSKELETAL:  No edema; No deformity  SKIN: Warm and dry NEUROLOGIC:  Alert and oriented x 3 PSYCHIATRIC:  Normal affect   Signed, Robert Brothersajan R Lyfe Reihl, MD  01/03/2019 4:01 PM    [facility]

## 2019-02-22 DIAGNOSIS — J309 Allergic rhinitis, unspecified: Secondary | ICD-10-CM | POA: Diagnosis not present

## 2019-02-22 DIAGNOSIS — I10 Essential (primary) hypertension: Secondary | ICD-10-CM | POA: Diagnosis not present

## 2019-02-22 DIAGNOSIS — I6521 Occlusion and stenosis of right carotid artery: Secondary | ICD-10-CM | POA: Diagnosis not present

## 2019-02-22 DIAGNOSIS — R42 Dizziness and giddiness: Secondary | ICD-10-CM | POA: Diagnosis not present

## 2019-02-22 DIAGNOSIS — I493 Ventricular premature depolarization: Secondary | ICD-10-CM | POA: Diagnosis not present

## 2019-02-22 DIAGNOSIS — E785 Hyperlipidemia, unspecified: Secondary | ICD-10-CM | POA: Diagnosis not present

## 2019-02-22 DIAGNOSIS — Z6825 Body mass index (BMI) 25.0-25.9, adult: Secondary | ICD-10-CM | POA: Diagnosis not present

## 2019-02-22 DIAGNOSIS — N400 Enlarged prostate without lower urinary tract symptoms: Secondary | ICD-10-CM | POA: Diagnosis not present

## 2019-02-22 DIAGNOSIS — E663 Overweight: Secondary | ICD-10-CM | POA: Diagnosis not present

## 2019-03-16 ENCOUNTER — Ambulatory Visit: Payer: Medicare HMO | Admitting: Cardiology

## 2019-04-09 ENCOUNTER — Ambulatory Visit: Payer: Medicare HMO | Admitting: Cardiology

## 2019-04-09 DIAGNOSIS — E785 Hyperlipidemia, unspecified: Secondary | ICD-10-CM | POA: Diagnosis not present

## 2019-04-09 DIAGNOSIS — E663 Overweight: Secondary | ICD-10-CM | POA: Diagnosis not present

## 2019-04-09 DIAGNOSIS — R42 Dizziness and giddiness: Secondary | ICD-10-CM | POA: Diagnosis not present

## 2019-04-09 DIAGNOSIS — I10 Essential (primary) hypertension: Secondary | ICD-10-CM | POA: Diagnosis not present

## 2019-04-09 DIAGNOSIS — I493 Ventricular premature depolarization: Secondary | ICD-10-CM | POA: Diagnosis not present

## 2019-04-09 DIAGNOSIS — Z6825 Body mass index (BMI) 25.0-25.9, adult: Secondary | ICD-10-CM | POA: Diagnosis not present

## 2019-04-09 DIAGNOSIS — N400 Enlarged prostate without lower urinary tract symptoms: Secondary | ICD-10-CM | POA: Diagnosis not present

## 2019-04-09 DIAGNOSIS — J309 Allergic rhinitis, unspecified: Secondary | ICD-10-CM | POA: Diagnosis not present

## 2019-05-09 ENCOUNTER — Other Ambulatory Visit: Payer: Self-pay

## 2019-05-09 ENCOUNTER — Ambulatory Visit (INDEPENDENT_AMBULATORY_CARE_PROVIDER_SITE_OTHER): Payer: Medicare HMO | Admitting: Cardiology

## 2019-05-09 ENCOUNTER — Encounter: Payer: Self-pay | Admitting: Cardiology

## 2019-05-09 VITALS — BP 150/80 | HR 51 | Ht 74.0 in | Wt 194.8 lb

## 2019-05-09 DIAGNOSIS — Z1329 Encounter for screening for other suspected endocrine disorder: Secondary | ICD-10-CM

## 2019-05-09 DIAGNOSIS — I7090 Unspecified atherosclerosis: Secondary | ICD-10-CM | POA: Diagnosis not present

## 2019-05-09 DIAGNOSIS — E782 Mixed hyperlipidemia: Secondary | ICD-10-CM

## 2019-05-09 DIAGNOSIS — Z9889 Other specified postprocedural states: Secondary | ICD-10-CM | POA: Diagnosis not present

## 2019-05-09 NOTE — Patient Instructions (Signed)
Medication Instructions:  Your physician recommends that you continue on your current medications as directed. Please refer to the Current Medication list given to you today.  *If you need a refill on your cardiac medications before your next appointment, please call your pharmacy*  Lab Work: Your physician recommends that you have a BMP, CBC, TSH, hepatic and lipid drawn  If you have labs (blood work) drawn today and your tests are completely normal, you will receive your results only by: . MyChart Message (if you have MyChart) OR . A paper copy in the mail If you have any lab test that is abnormal or we need to change your treatment, we will call you to review the results.  Testing/Procedures: NONE  Follow-Up: At [facility], you and your health needs are our priority.  As part of our continuing mission to provide you with exceptional heart care, we have created designated Provider Care Teams.  These Care Teams include your primary Cardiologist (physician) and Advanced Practice Providers (APPs -  Physician Assistants and Nurse Practitioners) who all work together to provide you with the care you need, when you need it.  Your next appointment:   6 months  The format for your next appointment:   In Person  Provider:   Rajan Revankar, MD   

## 2019-05-09 NOTE — Progress Notes (Signed)
Cardiology Office Note:    Date:  05/09/2019   ID:  Robert Roy, DOB March 18, 1946, MRN [id number]  PCP:  Cher Nakai, MD  Cardiologist:  Jenean Lindau, MD   Referring MD: Cher Nakai, MD    ASSESSMENT:    1. Status post carotid endarterectomy   2. Mixed dyslipidemia   3. Atherosclerotic vascular disease    PLAN:    In order of problems listed above:  1. Atherosclerotic vascular disease, cerebrovascular atherosclerosis s/p carotid endarterectomy: Secondary prevention stressed to the patient.  Importance of compliance with diet and medication stressed and he vocalized understanding.  His blood pressure is stable.  He checks it at a regular basis at home.  Diet was emphasized.  His blood pressure medication was withheld because of dizzy spells at times.  Symptoms have improved. 2. Mixed dyslipidemia: Diet was discussed lab work will be done today including fasting lipids he wants to switch medications to rosuvastatin and we will try this medication once we have the lab work. 3. Patient will be seen in follow-up appointment in 6 months or earlier if the patient has any concerns    Medication Adjustments/Labs and Tests Ordered: Current medicines are reviewed at length with the patient today.  Concerns regarding medicines are outlined above.  No orders of the defined types were placed in this encounter.  No orders of the defined types were placed in this encounter.    No chief complaint on file.    History of Present Illness:    Robert Roy is a 73 y.o. male.  Patient has history of atherosclerotic vascular disease.  He denies any problems at this time and takes care of activities of daily living.  No chest pain orthopnea or PND.  At the time of my evaluation, the patient is alert awake oriented and in no distress.  He leads a sedentary lifestyle.  He is an active gentleman.  Past Medical History:  Diagnosis Date  . BMI 28.0-28.9,adult   . History of kidney stones    h/o    Past Surgical History:  Procedure Laterality Date  . ENDARTERECTOMY Right 09/28/2018   Procedure: ENDARTERECTOMY CAROTID;  Surgeon: Algernon Huxley, MD;  Location: [facility];  Service: Vascular;  Laterality: Right;  . I&D EXTREMITY Right 10/14/2018   Procedure: exploration right neck,drainage abscess,drain placement, carotid patch removal,saphenous vein harvest;  Surgeon: Shelda Altes, MD;  Location: [facility];  Service: Vascular;  Laterality: Right;  . KIDNEY STONE SURGERY      Current Medications: Current Meds  Medication Sig  . aspirin 81 MG EC tablet Take 1 tablet (81 mg total) by mouth daily.  [name] atorvastatin (LIPITOR) 10 MG tablet Take 1 tablet (10 mg total) by mouth at bedtime.  . clopidogrel (PLAVIX) 75 MG tablet Take 1 tablet (75 mg total) by mouth daily with breakfast.  . tamsulosin (FLOMAX) 0.4 MG CAPS capsule Take 1 capsule by mouth daily.     Allergies:   Patient has no known allergies.   Social History   Socioeconomic History  . Marital status: Married    Spouse name: Not on file  . Number of children: Not on file  . Years of education: Not on file  . Highest education level: Not on file  Occupational History  . Not on file  Social Needs  . Financial resource strain: Not on file  . Food insecurity    Worry: Not on file    Inability: Not on file  .  Transportation needs    Medical: Not on file    Non-medical: Not on file  Tobacco Use  . Smoking status: Never Smoker  . Smokeless tobacco: Never Used  Substance and Sexual Activity  . Alcohol use: Never    Frequency: Never  . Drug use: Never  . Sexual activity: Not on file  Lifestyle  . Physical activity    Days per week: Not on file    Minutes per session: Not on file  . Stress: Not on file  Relationships  . Social Musician on phone: Not on file    Gets together: Not on file    Attends religious service: Not on file    Active member of club or organization: Not on file    Attends meetings  of clubs or organizations: Not on file    Relationship status: Not on file  Other Topics Concern  . Not on file  Social History Narrative  . Not on file     Family History: The patient's family history includes Heart disease in his father; Hypertension in his mother; Prostate cancer in his father.  ROS:   Please see the history of present illness.    All other systems reviewed and are negative.  EKGs/Labs/Other Studies Reviewed:    The following studies were reviewed today: I discussed my finding including lab work with the patient at length   Recent Labs: 08/16/2018: TSH 6.140 10/14/2018: ALT 15; Magnesium 2.3 10/16/2018: BUN 29; Creatinine, Ser 1.37; Hemoglobin 12.5; Platelets 215; Potassium 4.1; Sodium 140  Recent Lipid Panel    Component Value Date/Time   CHOL 154 08/16/2018 0820   TRIG 55 08/16/2018 0820   HDL 44 08/16/2018 0820   CHOLHDL 3.5 08/16/2018 0820   LDLCALC 99 08/16/2018 0820    Physical Exam:    VS:  BP (!) 150/80 (BP Location: Left Arm, Patient Position: Sitting, Cuff Size: Normal)   Pulse (!) 51   Ht 6\' 2"  (1.88 m)   Wt 194 lb 12.8 oz (88.4 kg)   SpO2 92%   BMI 25.01 kg/m     Wt Readings from Last 3 Encounters:  05/09/19 194 lb 12.8 oz (88.4 kg)  01/03/19 196 lb (88.9 kg)  12/12/18 196 lb 9.6 oz (89.2 kg)     GEN: Patient is in no acute distress HEENT: Normal NECK: No JVD; No carotid bruits LYMPHATICS: No lymphadenopathy CARDIAC: Hear sounds regular, 2/6 systolic murmur at the apex. RESPIRATORY:  Clear to auscultation without rales, wheezing or rhonchi  ABDOMEN: Soft, non-tender, non-distended MUSCULOSKELETAL:  No edema; No deformity  SKIN: Warm and dry NEUROLOGIC:  Alert and oriented x 3 PSYCHIATRIC:  Normal affect   Signed, 02/11/19, MD  05/09/2019 8:56 AM    [facility]

## 2019-05-10 LAB — CBC
Hematocrit: 41.6 % (ref 37.5–51.0)
Hemoglobin: 14.3 g/dL (ref 13.0–17.7)
MCH: 30.9 pg (ref 26.6–33.0)
MCHC: 34.4 g/dL (ref 31.5–35.7)
MCV: 90 fL (ref 79–97)
Platelets: 206 10*3/uL (ref 150–450)
RBC: 4.63 x10E6/uL (ref 4.14–5.80)
RDW: 13.2 % (ref 11.6–15.4)
WBC: 5.4 10*3/uL (ref 3.4–10.8)

## 2019-05-10 LAB — TSH: TSH: 0.021 u[IU]/mL — ABNORMAL LOW (ref 0.450–4.500)

## 2019-05-10 LAB — LIPID PANEL
Chol/HDL Ratio: 2.7 ratio (ref 0.0–5.0)
Cholesterol, Total: 128 mg/dL (ref 100–199)
HDL: 47 mg/dL (ref >39)
LDL Chol Calc (NIH): 70 mg/dL (ref 0–99)
Triglycerides: 48 mg/dL (ref 0–149)
VLDL Cholesterol Cal: 11 mg/dL (ref 5–40)

## 2019-05-10 LAB — HEPATIC FUNCTION PANEL
ALT: 19 IU/L (ref 0–44)
AST: 22 IU/L (ref 0–40)
Albumin: 4.9 g/dL — ABNORMAL HIGH (ref 3.8–4.8)
Alkaline Phosphatase: 100 IU/L (ref 39–117)
Bilirubin Total: 0.5 mg/dL (ref 0.0–1.2)
Bilirubin, Direct: 0.14 mg/dL (ref 0.00–0.40)
Total Protein: 7 g/dL (ref 6.0–8.5)

## 2019-05-10 LAB — BASIC METABOLIC PANEL
BUN/Creatinine Ratio: 16 (ref 10–24)
BUN: 23 mg/dL (ref 8–27)
CO2: 25 mmol/L (ref 20–29)
Calcium: 11.2 mg/dL — ABNORMAL HIGH (ref 8.6–10.2)
Chloride: 104 mmol/L (ref 96–106)
Creatinine, Ser: 1.42 mg/dL — ABNORMAL HIGH (ref 0.76–1.27)
GFR calc Af Amer: 57 mL/min/{1.73_m2} — ABNORMAL LOW (ref >59)
GFR calc non Af Amer: 50 mL/min/{1.73_m2} — ABNORMAL LOW (ref >59)
Glucose: 99 mg/dL (ref 65–99)
Potassium: 4.7 mmol/L (ref 3.5–5.2)
Sodium: 141 mmol/L (ref 134–144)

## 2019-05-15 ENCOUNTER — Telehealth: Payer: Self-pay

## 2019-05-15 NOTE — Telephone Encounter (Signed)
Patient answered stating he could not talk right now and would call back. Copy of results sent to Dr. Truman Hayward.

## 2019-05-15 NOTE — Telephone Encounter (Signed)
-----   Message from Jenean Lindau, MD sent at 05/10/2019  8:19 AM EST ----- Labs are fine.  Renal function is stable however thyroid is significantly abnormal and he needs to call his primary care physician today to address it.  Please send a copy to them. Jenean Lindau, MD 05/10/2019 8:19 AM

## 2019-05-16 NOTE — Telephone Encounter (Signed)
Results relayed, questions answered.

## 2019-05-17 DIAGNOSIS — N400 Enlarged prostate without lower urinary tract symptoms: Secondary | ICD-10-CM | POA: Diagnosis not present

## 2019-05-17 DIAGNOSIS — E663 Overweight: Secondary | ICD-10-CM | POA: Diagnosis not present

## 2019-05-17 DIAGNOSIS — I493 Ventricular premature depolarization: Secondary | ICD-10-CM | POA: Diagnosis not present

## 2019-05-17 DIAGNOSIS — R42 Dizziness and giddiness: Secondary | ICD-10-CM | POA: Diagnosis not present

## 2019-05-17 DIAGNOSIS — I10 Essential (primary) hypertension: Secondary | ICD-10-CM | POA: Diagnosis not present

## 2019-05-17 DIAGNOSIS — I6521 Occlusion and stenosis of right carotid artery: Secondary | ICD-10-CM | POA: Diagnosis not present

## 2019-05-17 DIAGNOSIS — J309 Allergic rhinitis, unspecified: Secondary | ICD-10-CM | POA: Diagnosis not present

## 2019-05-17 DIAGNOSIS — E785 Hyperlipidemia, unspecified: Secondary | ICD-10-CM | POA: Diagnosis not present

## 2019-05-17 DIAGNOSIS — R7989 Other specified abnormal findings of blood chemistry: Secondary | ICD-10-CM | POA: Diagnosis not present

## 2019-06-08 DIAGNOSIS — N401 Enlarged prostate with lower urinary tract symptoms: Secondary | ICD-10-CM | POA: Diagnosis not present

## 2019-06-08 DIAGNOSIS — R351 Nocturia: Secondary | ICD-10-CM | POA: Diagnosis not present

## 2019-06-15 ENCOUNTER — Ambulatory Visit (INDEPENDENT_AMBULATORY_CARE_PROVIDER_SITE_OTHER): Payer: Medicare HMO

## 2019-06-15 ENCOUNTER — Ambulatory Visit (INDEPENDENT_AMBULATORY_CARE_PROVIDER_SITE_OTHER): Payer: Medicare HMO | Admitting: Vascular Surgery

## 2019-06-15 ENCOUNTER — Other Ambulatory Visit: Payer: Self-pay

## 2019-06-15 ENCOUNTER — Encounter (INDEPENDENT_AMBULATORY_CARE_PROVIDER_SITE_OTHER): Payer: Self-pay | Admitting: Vascular Surgery

## 2019-06-15 VITALS — BP 162/89 | HR 73 | Resp 17 | Ht 74.0 in | Wt 196.0 lb

## 2019-06-15 DIAGNOSIS — E782 Mixed hyperlipidemia: Secondary | ICD-10-CM

## 2019-06-15 DIAGNOSIS — I6521 Occlusion and stenosis of right carotid artery: Secondary | ICD-10-CM | POA: Diagnosis not present

## 2019-06-15 DIAGNOSIS — I6523 Occlusion and stenosis of bilateral carotid arteries: Secondary | ICD-10-CM

## 2019-06-15 NOTE — Progress Notes (Signed)
MRN : [id number]  Robert Roy is a 73 y.o. (06/12/1946) male who presents with chief complaint of  Chief Complaint  Patient presents with  . Follow-up    ultrasound  .  History of Present Illness: Patient returns in follow-up of his carotid disease.  He is doing well.  He says his dizziness is better.  He still has some shortness of breath with extensive activity but his activity level has significantly increased and he is feeling well today.  He is very active.  His duplex today shows a patent right carotid endarterectomy and mild 1 to 39% left ICA stenosis.  Current Outpatient Medications  Medication Sig Dispense Refill  . aspirin 81 MG EC tablet Take 1 tablet (81 mg total) by mouth daily. 90 tablet 3  . atorvastatin (LIPITOR) 10 MG tablet Take 1 tablet (10 mg total) by mouth at bedtime. 90 tablet 3  . clopidogrel (PLAVIX) 75 MG tablet Take 1 tablet (75 mg total) by mouth daily with breakfast. 90 tablet 3  . tamsulosin (FLOMAX) 0.4 MG CAPS capsule Take 1 capsule by mouth daily.     No current facility-administered medications for this visit.    Past Medical History:  Diagnosis Date  . BMI 28.0-28.9,adult   . History of kidney stones    h/o    Past Surgical History:  Procedure Laterality Date  . ENDARTERECTOMY Right 09/28/2018   Procedure: ENDARTERECTOMY CAROTID;  Surgeon: Annice Needy, MD;  Location: [facility] ORS;  Service: Vascular;  Laterality: Right;  . I&D EXTREMITY Right 10/14/2018   Procedure: exploration right neck,drainage abscess,drain placement, carotid patch removal,saphenous vein harvest;  Surgeon: Ezequiel Essex, MD;  Location: [facility] ORS;  Service: Vascular;  Laterality: Right;  . KIDNEY STONE SURGERY       Social History   Tobacco Use  . Smoking status: Never Smoker  . Smokeless tobacco: Never Used  Substance Use Topics  . Alcohol use: Never  . Drug use: Never    Family History  Problem Relation Age of Onset  . Hypertension Mother   . Heart disease  Father   . Prostate cancer Father     No Known Allergies   REVIEW OF SYSTEMS (Negative unless checked)  Constitutional: [] Weight loss  [] Fever  [] Chills Cardiac: [] Chest pain   [] Chest pressure   [] Palpitations   [] Shortness of breath when laying flat   [] Shortness of breath at rest   [x] Shortness of breath with exertion. Vascular:  [] Pain in legs with walking   [] Pain in legs at rest   [] Pain in legs when laying flat   [] Claudication   [] Pain in feet when walking  [] Pain in feet at rest  [] Pain in feet when laying flat   [] History of DVT   [] Phlebitis   [] Swelling in legs   [] Varicose veins   [] Non-healing ulcers Pulmonary:   [] Uses home oxygen   [] Productive cough   [] Hemoptysis   [] Wheeze  [] COPD   [] Asthma Neurologic:  [x] Dizziness  [] Blackouts   [] Seizures   [] History of stroke   [] History of TIA  [] Aphasia   [] Temporary blindness   [] Dysphagia   [] Weakness or numbness in arms   [] Weakness or numbness in legs Musculoskeletal:  [] Arthritis   [] Joint swelling   [] Joint pain   [] Low back pain Hematologic:  [] Easy bruising  [] Easy bleeding   [] Hypercoagulable state   [] Anemic  [] Hepatitis Gastrointestinal:  [] Blood in stool   [] Vomiting blood  [] Gastroesophageal reflux/heartburn   [] Difficulty swallowing.  Genitourinary:  [] Chronic kidney disease   [] Difficult urination  [] Frequent urination  [] Burning with urination   [] Blood in urine Skin:  [] Rashes   [] Ulcers   [] Wounds Psychological:  [] History of anxiety   []  History of major depression.  Physical Examination  Vitals:   06/15/19 1100 06/15/19 1101  BP: (!) 178/92 (!) 162/89  Pulse: 73   Resp: 17   Weight: 196 lb (88.9 kg)   Height: 6\' 2"  (1.88 m)    Body mass index is 25.16 kg/m. Gen:  WD/WN, NAD Head: [place]/AT, No temporalis wasting. Ear/Nose/Throat: Hearing grossly intact, nares w/o erythema or drainage, trachea midline Eyes: Conjunctiva clear. Sclera non-icteric Neck: Supple.  Well-healed right CEA incision.  No carotid  bruit  Pulmonary:  Good air movement, equal and clear to auscultation bilaterally.  Cardiac: RRR, No JVD Vascular:  Vessel Right Left  Radial Palpable Palpable                   Musculoskeletal: M/S 5/5 throughout.  No deformity or atrophy.  No significant lower extremity edema. Neurologic: CN 2-12 intact. Sensation grossly intact in extremities.  Symmetrical.  Speech is fluent. Motor exam as listed above. Psychiatric: Judgment intact, Mood & affect appropriate for pt's clinical situation. Dermatologic: No rashes or ulcers noted.  No cellulitis or open wounds.      CBC Lab Results  Component Value Date   WBC 5.4 05/09/2019   HGB 14.3 05/09/2019   HCT 41.6 05/09/2019   MCV 90 05/09/2019   PLT 206 05/09/2019    BMET    Component Value Date/Time   NA 141 05/09/2019 0916   K 4.7 05/09/2019 0916   CL 104 05/09/2019 0916   CO2 25 05/09/2019 0916   GLUCOSE 99 05/09/2019 0916   GLUCOSE 104 (H) 10/16/2018 0338   BUN 23 05/09/2019 0916   CREATININE 1.42 (H) 05/09/2019 0916   CALCIUM 11.2 (H) 05/09/2019 0916   GFRNONAA 50 (L) 05/09/2019 0916   GFRAA 57 (L) 05/09/2019 0916   CrCl cannot be calculated (Patient's most recent lab result is older than the maximum 21 days allowed.).  COAG Lab Results  Component Value Date   INR 1.1 10/14/2018   INR 1.1 09/21/2018    Radiology No results found.   Assessment/Plan Mixed dyslipidemia lipid control important in reducing the progression of atherosclerotic disease. Continue statin therapy   Carotid stenosis His duplex today shows a patent right carotid endarterectomy and mild 1 to 39% left ICA stenosis.  Continue antiplatelet and statin therapy.  We will now transition to an annual duplex and follow-up.    Leotis Pain, MD  06/15/2019 11:16 AM    This note was created with Dragon medical transcription system.  Any errors from dictation are purely unintentional

## 2019-06-15 NOTE — Assessment & Plan Note (Signed)
His duplex today shows a patent right carotid endarterectomy and mild 1 to 39% left ICA stenosis.  Continue antiplatelet and statin therapy.  We will now transition to an annual duplex and follow-up.

## 2019-06-15 NOTE — Assessment & Plan Note (Signed)
lipid control important in reducing the progression of atherosclerotic disease. Continue statin therapy  

## 2019-06-15 NOTE — Patient Instructions (Signed)
Carotid Artery Disease  The carotid arteries are arteries on both sides of the neck. They carry blood to the brain, face, and neck. Carotid artery disease happens when these arteries become smaller (narrow) or get blocked. If these arteries become smaller or get blocked, you are more likely to have a stroke or a warning stroke (transient ischemic attack). Follow these instructions at home:  Take over-the-counter and prescription medicines only as told by your doctor.  Make sure you understand all instructions about your medicines. Do not stop taking your medicines without talking to your doctor first.  Follow your doctor's diet instructions. It is important to follow a healthy diet. ? Eat foods that include plenty of: ? Fresh fruits. ? Vegetables. ? Lean meats. ? Avoid these foods: ? Foods that are high in fat. ? Foods that are high in salt (sodium). ? Foods that are fried. ? Foods that are processed. ? Foods that have few good nutrients (poor nutritional value).  Keep a healthy weight.  Stay active. Get at least 30 minutes of activity every day.  Do not smoke.  Limit alcohol use to: ? No more than 2 drinks a day for men. ? No more than 1 drink a day for women who are not pregnant.  Do not use illegal drugs.  Keep all follow-up visits as told by your doctor. This is important. Contact a doctor if: Get help right away if:  You have any symptoms of stroke or TIA. The acronym BEFAST is an easy way to remember the main warning signs of stroke. ? B = Balance problems. Signs include dizziness, sudden trouble walking, or loss of balance ? E = Eye problems. This includes trouble seeing or a sudden change in vision. ? F = Face changes. This includes sudden weakness or numbness of the face, or the face or eyelid drooping to one side. ? A = Arm weakness or numbness. This happens suddenly and usually on one side of the body. ? S = Speech problems. This includes trouble speaking or  trouble understanding. ? T = Time. Time to call 911 or seek emergency care. Do not wait to see if symptoms go away. Make note of the time your symptoms started.  Other signs of stroke may include: ? A sudden, severe headache with no known cause. ? Feeling sick to your stomach (nauseous) or throwing up (vomiting). ? Seizure. Call your local emergency services (911 in [place] Do notdrive yourself to the clinic or hospital. Summary  The carotid arteries are arteries on both sides of the neck.  If these arteries get smaller or get blocked, you are more likely to have a stroke or a warning stroke (transient ischemic attack).  Take over-the-counter and prescription medicines only as told by your doctor.  Keep all follow-up visits as told by your doctor. This is important. This information is not intended to replace advice given to you by your health care provider. Make sure you discuss any questions you have with your health care provider. Document Released: 06/07/2012 Document Revised: 06/16/2017 Document Reviewed: 06/16/2017 Elsevier Patient Education  2020 Elsevier Inc.  

## 2019-08-02 ENCOUNTER — Other Ambulatory Visit (INDEPENDENT_AMBULATORY_CARE_PROVIDER_SITE_OTHER): Payer: Self-pay | Admitting: Vascular Surgery

## 2019-08-07 ENCOUNTER — Other Ambulatory Visit (INDEPENDENT_AMBULATORY_CARE_PROVIDER_SITE_OTHER): Payer: Self-pay | Admitting: Vascular Surgery

## 2019-10-18 IMAGING — CT CT ANGIO NECK
2 of 7 series · 8 of 33 positions shown · IV contrast (iopamidol)
Comparison: None.

CLINICAL DATA: Follow-up carotid stenosis.

EXAM:
CT ANGIOGRAPHY NECK
TECHNIQUE: Multidetector CT imaging of the neck was performed using the
standard protocol during bolus administration of intravenous
contrast. Multiplanar CT image reconstructions and MIPs were
obtained to evaluate the vascular anatomy. Carotid stenosis
measurements (when applicable) are obtained utilizing NASCET
criteria, using the distal internal carotid diameter as the
denominator.
CONTRAST:  100mL 2OEFQ5-OT0 IOPAMIDOL (2OEFQ5-OT0) INJECTION 76%

[Series 5: cta neck · axial · 0.56mm/px · z∈[-196,-108]mm · 2 of 134 slices shown]
[im 45/134  soft-tissue]
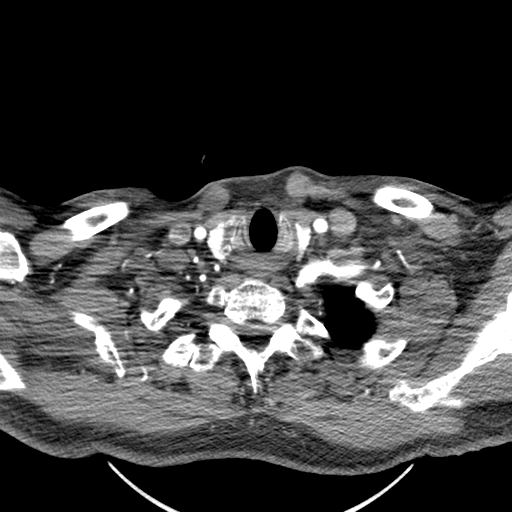
[im 89/134  soft-tissue]
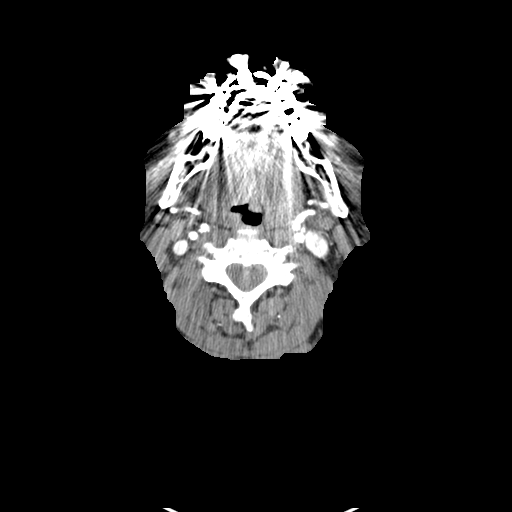

[Series 7: ax thin · axial · 0.41mm/px · z∈[-258,-57]mm · 6 of 284 slices shown]
[im 41/284  soft-tissue]
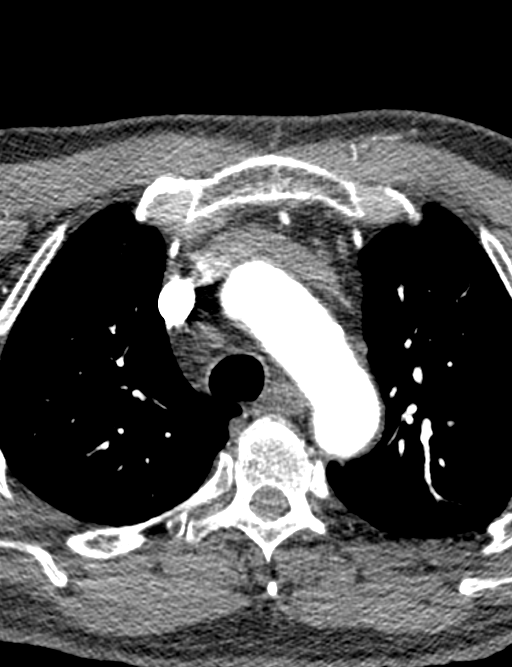
[im 81/284  bone]
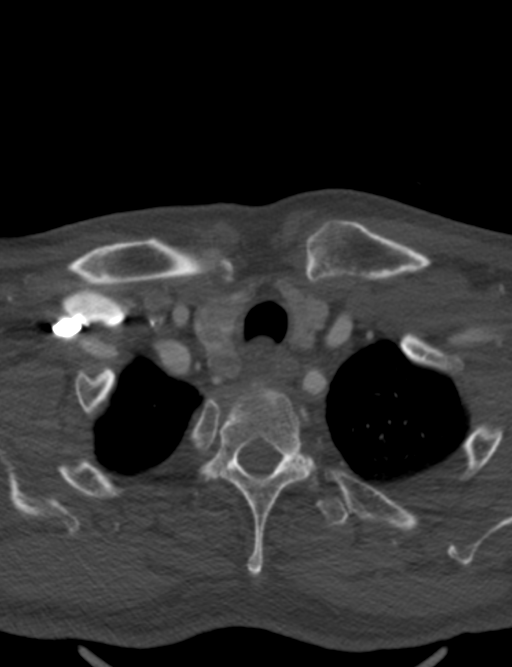
[im 122/284  soft-tissue]
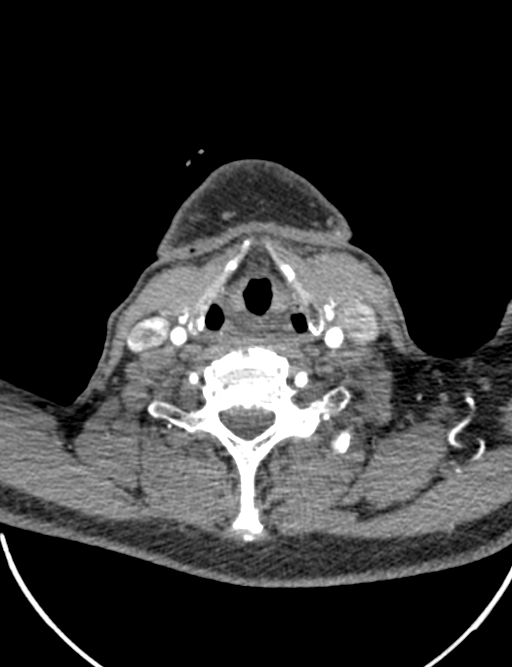
[im 162/284  bone]
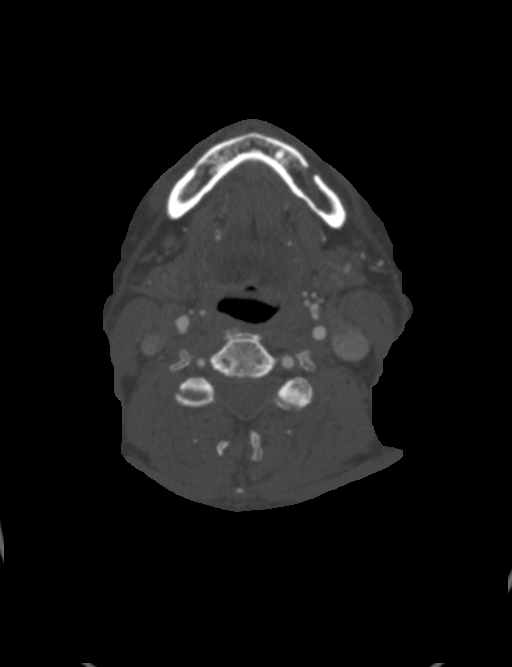
[im 203/284  soft-tissue]
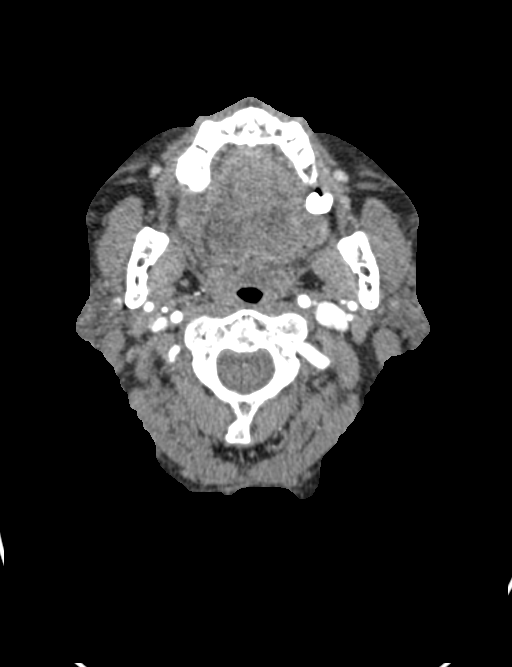
[im 243/284  bone]
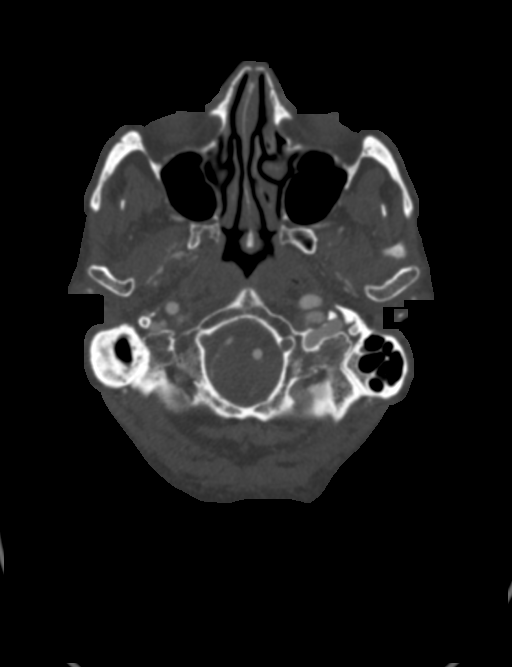

[8 of 33 positions shown; findings below may reference images not displayed]

FINDINGS: Aortic arch: Atherosclerotic plaque. No dilatation or dissection.
Three vessel branching.

Right carotid system: Atheromatous wall thickening of the common
carotid and mainly low-density plaque at the bulb with deep
ulceration. The proximal ICA is narrowed by 70-80%.

Left carotid system: Atheromatous wall thickening of the common
carotid with mainly low-density plaque at the bulb. Left ICA origin
stenosis measures 40%.

Vertebral arteries: No proximal subclavian flow limiting stenosis.
Advanced narrowing of the left vertebral origin due to
atherosclerotic plaque, ~ 80%. Subjective mild to moderate
narrowing of the smaller right vertebral origin.

Skeleton: Disc degeneration and facet arthropathy. No acute or
aggressive finding

Other neck: Right tracheoesophageal groove nodule measuring 13 mm,
most likely an exophytic thyroid nodule, below size threshold for
sonographic follow-up recommendations per consensus guidelines.

Upper chest: No acute finding
IMPRESSION: 1. 70-80% right ICA bulb narrowing due to ulcerated plaque.
2. At least 80% narrowing at the origin of the dominant left
vertebral artery.
3. 40% left ICA origin stenosis.
4. Mild to moderate right vertebral origin stenosis.

## 2019-11-08 DIAGNOSIS — H524 Presbyopia: Secondary | ICD-10-CM | POA: Diagnosis not present

## 2019-11-08 DIAGNOSIS — H25013 Cortical age-related cataract, bilateral: Secondary | ICD-10-CM | POA: Diagnosis not present

## 2019-11-08 DIAGNOSIS — H40013 Open angle with borderline findings, low risk, bilateral: Secondary | ICD-10-CM | POA: Diagnosis not present

## 2019-11-08 DIAGNOSIS — H2513 Age-related nuclear cataract, bilateral: Secondary | ICD-10-CM | POA: Diagnosis not present

## 2019-11-16 ENCOUNTER — Other Ambulatory Visit: Payer: Self-pay

## 2019-11-16 ENCOUNTER — Encounter: Payer: Self-pay | Admitting: Cardiology

## 2019-11-16 ENCOUNTER — Ambulatory Visit: Payer: Medicare HMO | Admitting: Cardiology

## 2019-11-16 VITALS — BP 132/70 | HR 72 | Ht 74.0 in | Wt 199.0 lb

## 2019-11-16 DIAGNOSIS — Z9889 Other specified postprocedural states: Secondary | ICD-10-CM | POA: Diagnosis not present

## 2019-11-16 DIAGNOSIS — I493 Ventricular premature depolarization: Secondary | ICD-10-CM

## 2019-11-16 DIAGNOSIS — I7090 Unspecified atherosclerosis: Secondary | ICD-10-CM | POA: Diagnosis not present

## 2019-11-16 DIAGNOSIS — E782 Mixed hyperlipidemia: Secondary | ICD-10-CM | POA: Diagnosis not present

## 2019-11-16 MED ORDER — ROSUVASTATIN CALCIUM 10 MG PO TABS: 10.0000 mg | ORAL_TABLET | Freq: Every day | ORAL | 3 refills | Status: DC

## 2019-11-16 NOTE — Addendum Note (Signed)
Addended by: Eleonore Chiquito on: 11/16/2019 04:23 PM   Modules accepted: Orders

## 2019-11-16 NOTE — Patient Instructions (Signed)
Medication Instructions:  Your physician has recommended you make the following change in your medication: Stop the lipitor Start Crestor 10 mg daily  *If you need a refill on your cardiac medications before your next appointment, please call your pharmacy*   Lab Work: Your physician recommends that you return for a BMET and LFT today and again in 6 weeks.  You need to have labs done when you are fasting.  You can come Monday through Friday 8:30 am to 12:00 pm and 1:15 to 4:30. You do not need to make an appointment as the order has already been placed.    If you have labs (blood work) drawn today and your tests are completely normal, you will receive your results only by: [name] MyChart Message (if you have MyChart) OR . A paper copy in the mail If you have any lab test that is abnormal or we need to change your treatment, we will call you to review the results.   Testing/Procedures: None ordered   Follow-Up: At [facility], you and your health needs are our priority.  As part of our continuing mission to provide you with exceptional heart care, we have created designated Provider Care Teams.  These Care Teams include your primary Cardiologist (physician) and Advanced Practice Providers (APPs -  Physician Assistants and Nurse Practitioners) who all work together to provide you with the care you need, when you need it.  We recommend signing up for the patient portal called "MyChart".  Sign up information is provided on this After Visit Summary.  MyChart is used to connect with patients for Virtual Visits (Telemedicine).  Patients are able to view lab/test results, encounter notes, upcoming appointments, etc.  Non-urgent messages can be sent to your provider as well.   To learn more about what you can do with MyChart, go to [email or web address].    Your next appointment:   6 month(s)  The format for your next appointment:   In Person  Provider:   Belva Crome, MD   Other  Instructions Rosuvastatin Tablets What is this medicine? ROSUVASTATIN (roe SOO va sta tin) is known as a HMG-CoA reductase inhibitor or 'statin'. It lowers cholesterol and triglycerides in the blood. This drug may also reduce the risk of heart attack, stroke, or other health problems in patients with risk factors for heart disease. Diet and lifestyle changes are often used with this drug. This medicine may be used for other purposes; ask your health care provider or pharmacist if you have questions. COMMON BRAND NAME(S): Crestor What should I tell my health care provider before I take this medicine? They need to know if you have any of these conditions:  diabetes  if you often drink alcohol  history of stroke  kidney disease  liver disease  muscle aches or weakness  thyroid disease  an unusual or allergic reaction to rosuvastatin, other medicines, foods, dyes, or preservatives  pregnant or trying to get pregnant  breast-feeding How should I use this medicine? Take this medicine by mouth with a glass of water. Follow the directions on the prescription label. Do not cut, crush or chew this medicine. You can take this medicine with or without food. Take your doses at regular intervals. Do not take your medicine more often than directed. Talk to your pediatrician regarding the use of this medicine in children. While this drug may be prescribed for children as young as 87 years old for selected conditions, precautions do apply. Overdosage: If you think you  have taken too much of this medicine contact a poison control center or emergency room at once. NOTE: This medicine is only for you. Do not share this medicine with others. What if I miss a dose? If you miss a dose, take it as soon as you can. If your next dose is to be taken in less than 12 hours, then do not take the missed dose. Take the next dose at your regular time. Do not take double or extra doses. What may interact with this  medicine? Do not take this medicine with any of the following medications:  herbal medicines like red yeast rice This medicine may also interact with the following medications:  alcohol  antacids containing aluminum hydroxide or magnesium hydroxide  cyclosporine  other medicines for high cholesterol  some medicines for HIV infection  warfarin This list may not describe all possible interactions. Give your health care provider a list of all the medicines, herbs, non-prescription drugs, or dietary supplements you use. Also tell them if you smoke, drink alcohol, or use illegal drugs. Some items may interact with your medicine. What should I watch for while using this medicine? Visit your doctor or health care professional for regular check-ups. You may need regular tests to make sure your liver is working properly. Your health care professional may tell you to stop taking this medicine if you develop muscle problems. If your muscle problems do not go away after stopping this medicine, contact your health care professional. Do not become pregnant while taking this medicine. Women should inform their health care professional if they wish to become pregnant or think they might be pregnant. There is a potential for serious side effects to an unborn child. Talk to your health care professional or pharmacist for more information. Do not breast-feed an infant while taking this medicine. This medicine may increase blood sugar. Ask your healthcare provider if changes in diet or medicines are needed if you have diabetes. If you are going to need surgery or other procedure, tell your doctor that you are using this medicine. This drug is only part of a total heart-health program. Your doctor or a dietician can suggest a low-cholesterol and low-fat diet to help. Avoid alcohol and smoking, and keep a proper exercise schedule. This medicine may cause a decrease in Co-Enzyme Q-10. You should make sure that you  get enough Co-Enzyme Q-10 while you are taking this medicine. Discuss the foods you eat and the vitamins you take with your health care professional. What side effects may I notice from receiving this medicine? Side effects that you should report to your doctor or health care professional as soon as possible:  allergic reactions like skin rash, itching or hives, swelling of the face, lips, or tongue  confusion  joint pain  loss of memory  redness, blistering, peeling or loosening of the skin, including inside the mouth  signs and symptoms of high blood sugar such as being more thirsty or hungry or having to urinate more than normal. You may also feel very tired or have blurry vision.  signs and symptoms of muscle injury like dark urine; trouble passing urine or change in the amount of urine; unusually weak or tired; muscle pain or side or back pain  yellowing of the eyes or skin Side effects that usually do not require medical attention (report to your doctor or health care professional if they continue or are bothersome):  constipation  diarrhea  dizziness  gas  headache  nausea  stomach pain  trouble sleeping  upset stomach This list may not describe all possible side effects. Call your doctor for medical advice about side effects. You may report side effects to FDA at 1-800-FDA-1088. Where should I keep my medicine? Keep out of the reach of children. Store at room temperature between 20 and 25 degrees C (68 and 77 degrees F). Keep container tightly closed (protect from moisture). Throw away any unused medicine after the expiration date. NOTE: This sheet is a summary. It may not cover all possible information. If you have questions about this medicine, talk to your doctor, pharmacist, or health care provider.  2020 Elsevier/Gold Standard (2018-04-13 08:25:08)

## 2019-11-16 NOTE — Progress Notes (Signed)
Cardiology Office Note:    Date:  11/16/2019   ID:  Robert Roy, DOB 06/17/1946, MRN [id number]  PCP:  Cher Nakai, MD  Cardiologist:  Jenean Lindau, MD   Referring MD: Cher Nakai, MD    ASSESSMENT:    1. Atherosclerotic vascular disease   2. Mixed dyslipidemia   3. Status post carotid endarterectomy    PLAN:    In order of problems listed above:  1. Secondary prevention stressed with the patient.  Importance of compliance with diet medication stressed and he vocalized understanding. 2. Atherosclerotic vascular disease and carotid endarterectomy: Stable at this time. 3. Mixed dyslipidemia: Patient on atorvastatin and wants to switch to rosuvastatin.  We will respect his wishes he will have a Chem-7 and liver panel today and will switch to rosuvastatin 10 mg daily liver lipid check will be done in 6 weeks. 4. Disease: He has had a longstanding history of this and this is stable. 5. Patient will be seen in follow-up appointment in 6 months or earlier if the patient has any concerns    Medication Adjustments/Labs and Tests Ordered: Current medicines are reviewed at length with the patient today.  Concerns regarding medicines are outlined above.  No orders of the defined types were placed in this encounter.  No orders of the defined types were placed in this encounter.    Chief Complaint  Patient presents with  . Follow-up     History of Present Illness:    Robert Roy is a 74 y.o. male.  Patient has past medical history of atherosclerotic vascular disease, mixed dyslipidemia and PVCs.  He denies any problems at this time and takes care of activities of daily living.  No chest pain orthopnea or PND.  At the time of my evaluation, the patient is alert awake oriented and in no distress.  Past Medical History:  Diagnosis Date  . BMI 28.0-28.9,adult   . History of kidney stones    h/o    Past Surgical History:  Procedure Laterality Date  . ENDARTERECTOMY Right  09/28/2018   Procedure: ENDARTERECTOMY CAROTID;  Surgeon: Algernon Huxley, MD;  Location: [facility] ORS;  Service: Vascular;  Laterality: Right;  . I & D EXTREMITY Right 10/14/2018   Procedure: exploration right neck,drainage abscess,drain placement, carotid patch removal,saphenous vein harvest;  Surgeon: Shelda Altes, MD;  Location: [facility] ORS;  Service: Vascular;  Laterality: Right;  . KIDNEY STONE SURGERY      Current Medications: Current Meds  Medication Sig  . atorvastatin (LIPITOR) 10 MG tablet TAKE ONE TABLET BY MOUTH AT BEDTIME  . clopidogrel (PLAVIX) 75 MG tablet TAKE ONE TABLET BY MOUTH EVERY DAY WITH BREAKFAST  . finasteride (PROSCAR) 5 MG tablet Take 5 mg by mouth daily.  [name] GOODSENSE ASPIRIN LOW DOSE 81 MG EC tablet TAKE ONE TABLET BY MOUTH EVERY DAY  . lisinopril-hydrochlorothiazide (ZESTORETIC) 10-12.5 MG tablet Take by mouth daily.  . tamsulosin (FLOMAX) 0.4 MG CAPS capsule Take 1 capsule by mouth daily.     Allergies:   Patient has no known allergies.   Social History   Socioeconomic History  . Marital status: Married    Spouse name: Not on file  . Number of children: Not on file  . Years of education: Not on file  . Highest education level: Not on file  Occupational History  . Not on file  Tobacco Use  . Smoking status: Never Smoker  . Smokeless tobacco: Never Used  Substance and Sexual  Activity  . Alcohol use: Never  . Drug use: Never  . Sexual activity: Not on file  Other Topics Concern  . Not on file  Social History Narrative  . Not on file   Social Determinants of Health   Financial Resource Strain:   . Difficulty of Paying Living Expenses:   Food Insecurity:   . Worried About Programme researcher, broadcasting/film/video in the Last Year:   . Barista in the Last Year:   Transportation Needs:   . Freight forwarder (Medical):   [name] Lack of Transportation (Non-Medical):   Physical Activity:   . Days of Exercise per Week:   . Minutes of Exercise per Session:    Stress:   . Feeling of Stress :   Social Connections:   . Frequency of Communication with Friends and Family:   . Frequency of Social Gatherings with Friends and Family:   . Attends Religious Services:   . Active Member of Clubs or Organizations:   . Attends Banker Meetings:   [name] Marital Status:      Family History: The patient's family history includes Heart disease in his father; Hypertension in his mother; Prostate cancer in his father.  ROS:   Please see the history of present illness.    All other systems reviewed and are negative.  EKGs/Labs/Other Studies Reviewed:    The following studies were reviewed today: I discussed my findings with the patient at length.  EKG reveals sinus rhythm and PVCs.   Recent Labs: 05/09/2019: ALT 19; BUN 23; Creatinine, Ser 1.42; Hemoglobin 14.3; Platelets 206; Potassium 4.7; Sodium 141; TSH 0.021  Recent Lipid Panel    Component Value Date/Time   CHOL 128 05/09/2019 0916   TRIG 48 05/09/2019 0916   HDL 47 05/09/2019 0916   CHOLHDL 2.7 05/09/2019 0916   LDLCALC 70 05/09/2019 0916    Physical Exam:    VS:  BP 132/70   Pulse 72   Ht 6\' 2"  (1.88 m)   Wt 199 lb (90.3 kg)   SpO2 98%   BMI 25.55 kg/m     Wt Readings from Last 3 Encounters:  11/16/19 199 lb (90.3 kg)  06/15/19 196 lb (88.9 kg)  05/09/19 194 lb 12.8 oz (88.4 kg)     GEN: Patient is in no acute distress HEENT: Normal NECK: No JVD; No carotid bruits LYMPHATICS: No lymphadenopathy CARDIAC: Hear sounds regular, 2/6 systolic murmur at the apex. RESPIRATORY:  Clear to auscultation without rales, wheezing or rhonchi  ABDOMEN: Soft, non-tender, non-distended MUSCULOSKELETAL:  No edema; No deformity  SKIN: Warm and dry NEUROLOGIC:  Alert and oriented x 3 PSYCHIATRIC:  Normal affect   Signed, 13/04/20, MD  11/16/2019 3:27 PM    [facility]

## 2019-11-17 LAB — BASIC METABOLIC PANEL
BUN/Creatinine Ratio: 18 (ref 10–24)
BUN: 24 mg/dL (ref 8–27)
CO2: 22 mmol/L (ref 20–29)
Calcium: 10.6 mg/dL — ABNORMAL HIGH (ref 8.6–10.2)
Chloride: 108 mmol/L — ABNORMAL HIGH (ref 96–106)
Creatinine, Ser: 1.37 mg/dL — ABNORMAL HIGH (ref 0.76–1.27)
GFR calc Af Amer: 60 mL/min/{1.73_m2} (ref >59)
GFR calc non Af Amer: 52 mL/min/{1.73_m2} — ABNORMAL LOW (ref >59)
Glucose: 120 mg/dL — ABNORMAL HIGH (ref 65–99)
Potassium: 4.4 mmol/L (ref 3.5–5.2)
Sodium: 143 mmol/L (ref 134–144)

## 2019-11-17 LAB — HEPATIC FUNCTION PANEL
ALT: 16 IU/L (ref 0–44)
AST: 19 IU/L (ref 0–40)
Albumin: 4.1 g/dL (ref 3.7–4.7)
Alkaline Phosphatase: 94 IU/L (ref 39–117)
Bilirubin Total: 0.3 mg/dL (ref 0.0–1.2)
Bilirubin, Direct: 0.12 mg/dL (ref 0.00–0.40)
Total Protein: 6 g/dL (ref 6.0–8.5)

## 2019-11-26 NOTE — Addendum Note (Signed)
Addended by: Eleonore Chiquito on: 11/26/2019 08:47 AM   Modules accepted: Orders

## 2019-12-10 DIAGNOSIS — R351 Nocturia: Secondary | ICD-10-CM | POA: Diagnosis not present

## 2019-12-10 DIAGNOSIS — N401 Enlarged prostate with lower urinary tract symptoms: Secondary | ICD-10-CM | POA: Diagnosis not present

## 2020-04-30 ENCOUNTER — Other Ambulatory Visit (INDEPENDENT_AMBULATORY_CARE_PROVIDER_SITE_OTHER): Payer: Self-pay | Admitting: Vascular Surgery

## 2020-05-26 DIAGNOSIS — Z6828 Body mass index (BMI) 28.0-28.9, adult: Secondary | ICD-10-CM | POA: Insufficient documentation

## 2020-05-26 DIAGNOSIS — Z87442 Personal history of urinary calculi: Secondary | ICD-10-CM | POA: Insufficient documentation

## 2020-05-27 ENCOUNTER — Other Ambulatory Visit: Payer: Self-pay

## 2020-05-27 ENCOUNTER — Encounter: Payer: Self-pay | Admitting: Cardiology

## 2020-05-27 ENCOUNTER — Ambulatory Visit: Payer: Medicare HMO | Admitting: Cardiology

## 2020-05-27 VITALS — BP 140/78 | HR 62 | Ht 74.0 in | Wt 198.4 lb

## 2020-05-27 DIAGNOSIS — Z9889 Other specified postprocedural states: Secondary | ICD-10-CM

## 2020-05-27 DIAGNOSIS — E782 Mixed hyperlipidemia: Secondary | ICD-10-CM | POA: Diagnosis not present

## 2020-05-27 DIAGNOSIS — I7090 Unspecified atherosclerosis: Secondary | ICD-10-CM | POA: Diagnosis not present

## 2020-05-27 DIAGNOSIS — Z1329 Encounter for screening for other suspected endocrine disorder: Secondary | ICD-10-CM

## 2020-05-27 NOTE — Progress Notes (Signed)
Cardiology Office Note:    Date:  05/27/2020   ID:  Robert Roy, DOB [birth year], MRN [id number]  PCP:  Simone Curia, MD  Cardiologist:  Garwin Brothers, MD   Referring MD: Simone Curia, MD    ASSESSMENT:    1. Atherosclerotic vascular disease   2. Status post carotid endarterectomy   3. Mixed dyslipidemia    PLAN:    In order of problems listed above:  1. Atherosclerotic vascular disease: Carotid artery disease post endarterectomy: Secondary prevention stressed with the patient.  Importance of compliance with diet medication stressed any vocalized understanding.  Patient has carotid endarterectomy on the right and bruit on the left on physical examination.  It has been about 2 years since his surgery.  Do a follow-up carotid Doppler to evaluate the status of the surgical repair and also the bruit on the left.  I discussed this with him and he vocalized understanding. 2. Mixed dyslipidemia: Diet was emphasized.  He is on statin therapy.  He will be back in the next few days for complete blood work including fasting lipids. 3. Importance of regular exercise stressed.  He was urged to walk at least 5 days a week half an hour a day and he promises to do so.Patient will be seen in follow-up appointment in 6 months or earlier if the patient has any concerns    Medication Adjustments/Labs and Tests Ordered: Current medicines are reviewed at length with the patient today.  Concerns regarding medicines are outlined above.  No orders of the defined types were placed in this encounter.  No orders of the defined types were placed in this encounter.    No chief complaint on file.    History of Present Illness:    Robert Roy is a 74 y.o. male.  Patient has past medical history of atherosclerotic vascular disease.  He is undergoing carotid endarterectomy on the right.  He denies any problems at this time and takes care of activities of daily living.  No chest pain orthopnea or PND.  He  does well with diet and exercise.  Is a little sad because he lost his sister last week.  At the time of my evaluation, the patient is alert awake oriented and in no distress.  Past Medical History:  Diagnosis Date  . Atherosclerotic vascular disease 01/03/2019  . BMI 28.0-28.9,adult   . Carotid stenosis 08/09/2018  . Carotid stenosis, right 09/28/2018  . History of kidney stones    h/o  . Mixed dyslipidemia 01/03/2019  . PVC (premature ventricular contraction) 01/16/2018  . Status post carotid endarterectomy 01/03/2019    Past Surgical History:  Procedure Laterality Date  . ENDARTERECTOMY Right 09/28/2018   Procedure: ENDARTERECTOMY CAROTID;  Surgeon: Annice Needy, MD;  Location: [facility] ORS;  Service: Vascular;  Laterality: Right;  . I & D EXTREMITY Right 10/14/2018   Procedure: exploration right neck,drainage abscess,drain placement, carotid patch removal,saphenous vein harvest;  Surgeon: Ezequiel Essex, MD;  Location: [facility] ORS;  Service: Vascular;  Laterality: Right;  . KIDNEY STONE SURGERY      Current Medications: Current Meds  Medication Sig  . ASPIRIN ADULT LOW STRENGTH 81 MG EC tablet TAKE ONE TABLET BY MOUTH EVERY DAY  . clopidogrel (PLAVIX) 75 MG tablet TAKE ONE TABLET BY MOUTH EVERY DAY WITH BREAKFAST  . finasteride (PROSCAR) 5 MG tablet Take 5 mg by mouth daily.  . tamsulosin (FLOMAX) 0.4 MG CAPS capsule Take 1 capsule by mouth daily.  Allergies:   Patient has no known allergies.   Social History   Socioeconomic History  . Marital status: Married    Spouse name: Not on file  . Number of children: Not on file  . Years of education: Not on file  . Highest education level: Not on file  Occupational History  . Not on file  Tobacco Use  . Smoking status: Never Smoker  . Smokeless tobacco: Never Used  Vaping Use  . Vaping Use: Never used  Substance and Sexual Activity  . Alcohol use: Never  . Drug use: Never  . Sexual activity: Not on file  Other Topics Concern  .  Not on file  Social History Narrative  . Not on file   Social Determinants of Health   Financial Resource Strain:   . Difficulty of Paying Living Expenses: Not on file  Food Insecurity:   . Worried About Programme researcher, broadcasting/film/video in the Last Year: Not on file  . Ran Out of Food in the Last Year: Not on file  Transportation Needs:   . Lack of Transportation (Medical): Not on file  . Lack of Transportation (Non-Medical): Not on file  Physical Activity:   . Days of Exercise per Week: Not on file  . Minutes of Exercise per Session: Not on file  Stress:   . Feeling of Stress : Not on file  Social Connections:   . Frequency of Communication with Friends and Family: Not on file  . Frequency of Social Gatherings with Friends and Family: Not on file  . Attends Religious Services: Not on file  . Active Member of Clubs or Organizations: Not on file  . Attends Banker Meetings: Not on file  . Marital Status: Not on file     Family History: The patient's family history includes Heart disease in his father; Hypertension in his mother; Prostate cancer in his father.  ROS:   Please see the history of present illness.    All other systems reviewed and are negative.  EKGs/Labs/Other Studies Reviewed:    The following studies were reviewed today: EKG with sinus rhythm and nonspecific ST-T changes   Recent Labs: 11/16/2019: ALT 16; BUN 24; Creatinine, Ser 1.37; Potassium 4.4; Sodium 143  Recent Lipid Panel    Component Value Date/Time   CHOL 128 05/09/2019 0916   TRIG 48 05/09/2019 0916   HDL 47 05/09/2019 0916   CHOLHDL 2.7 05/09/2019 0916   LDLCALC 70 05/09/2019 0916    Physical Exam:    VS:  BP 140/78   Pulse 62   Ht 6\' 2"  (1.88 m)   Wt 198 lb 6.4 oz (90 kg)   SpO2 95%   BMI 25.47 kg/m     Wt Readings from Last 3 Encounters:  05/27/20 198 lb 6.4 oz (90 kg)  11/16/19 199 lb (90.3 kg)  06/15/19 196 lb (88.9 kg)     GEN: Patient is in no acute distress HEENT:  Normal NECK: No JVD; Left carotid bruits LYMPHATICS: No lymphadenopathy CARDIAC: Hear sounds regular, 2/6 systolic murmur at the apex. RESPIRATORY:  Clear to auscultation without rales, wheezing or rhonchi  ABDOMEN: Soft, non-tender, non-distended MUSCULOSKELETAL:  No edema; No deformity  SKIN: Warm and dry NEUROLOGIC:  Alert and oriented x 3 PSYCHIATRIC:  Normal affect   Signed, 14/11/20, MD  05/27/2020 3:41 PM    [facility]

## 2020-05-27 NOTE — Patient Instructions (Signed)
Medication Instructions:  No medication changes. *If you need a refill on your cardiac medications before your next appointment, please call your pharmacy*   Lab Work: Your physician recommends that you return for lab work in: next few days. You need to have labs done when you are fasting.  You can come Monday through Friday 8:30 am to 12:00 pm and 1:15 to 4:30. You do not need to make an appointment as the order has already been placed. The labs you are going to have done are BMET, CBC, TSH, LFT and Lipids.  If you have labs (blood work) drawn today and your tests are completely normal, you will receive your results only by: [name] MyChart Message (if you have MyChart) OR . A paper copy in the mail If you have any lab test that is abnormal or we need to change your treatment, we will call you to review the results.   Testing/Procedures: Your physician has requested that you have a carotid duplex. This test is an ultrasound of the carotid arteries in your neck. It looks at blood flow through these arteries that supply the brain with blood. Allow one hour for this exam. There are no restrictions or special instructions.    Follow-Up: At [facility], you and your health needs are our priority.  As part of our continuing mission to provide you with exceptional heart care, we have created designated Provider Care Teams.  These Care Teams include your primary Cardiologist (physician) and Advanced Practice Providers (APPs -  Physician Assistants and Nurse Practitioners) who all work together to provide you with the care you need, when you need it.  We recommend signing up for the patient portal called "MyChart".  Sign up information is provided on this After Visit Summary.  MyChart is used to connect with patients for Virtual Visits (Telemedicine).  Patients are able to view lab/test results, encounter notes, upcoming appointments, etc.  Non-urgent messages can be sent to your provider as well.   To  learn more about what you can do with MyChart, go to [email or web address].    Your next appointment:   6 month(s)  The format for your next appointment:   In Person  Provider:   Belva Crome, MD   Other Instructions NA

## 2020-06-03 DIAGNOSIS — I7090 Unspecified atherosclerosis: Secondary | ICD-10-CM | POA: Diagnosis not present

## 2020-06-03 DIAGNOSIS — Z1329 Encounter for screening for other suspected endocrine disorder: Secondary | ICD-10-CM | POA: Diagnosis not present

## 2020-06-03 DIAGNOSIS — Z9889 Other specified postprocedural states: Secondary | ICD-10-CM | POA: Diagnosis not present

## 2020-06-03 DIAGNOSIS — E782 Mixed hyperlipidemia: Secondary | ICD-10-CM | POA: Diagnosis not present

## 2020-06-04 LAB — HEPATIC FUNCTION PANEL
ALT: 16 IU/L (ref 0–44)
AST: 16 IU/L (ref 0–40)
Albumin: 4.1 g/dL (ref 3.7–4.7)
Alkaline Phosphatase: 76 IU/L (ref 44–121)
Bilirubin Total: 0.6 mg/dL (ref 0.0–1.2)
Bilirubin, Direct: 0.21 mg/dL (ref 0.00–0.40)
Total Protein: 5.9 g/dL — ABNORMAL LOW (ref 6.0–8.5)

## 2020-06-04 LAB — CBC WITH DIFFERENTIAL/PLATELET
Basophils Absolute: 0 10*3/uL (ref 0.0–0.2)
Basos: 1 %
EOS (ABSOLUTE): 0.2 10*3/uL (ref 0.0–0.4)
Eos: 3 %
Hematocrit: 40.4 % (ref 37.5–51.0)
Hemoglobin: 13.8 g/dL (ref 13.0–17.7)
Immature Grans (Abs): 0 10*3/uL (ref 0.0–0.1)
Immature Granulocytes: 0 %
Lymphocytes Absolute: 1.3 10*3/uL (ref 0.7–3.1)
Lymphs: 23 %
MCH: 31.8 pg (ref 26.6–33.0)
MCHC: 34.2 g/dL (ref 31.5–35.7)
MCV: 93 fL (ref 79–97)
Monocytes Absolute: 0.4 10*3/uL (ref 0.1–0.9)
Monocytes: 7 %
Neutrophils Absolute: 3.6 10*3/uL (ref 1.4–7.0)
Neutrophils: 66 %
Platelets: 163 10*3/uL (ref 150–450)
RBC: 4.34 x10E6/uL (ref 4.14–5.80)
RDW: 12.6 % (ref 11.6–15.4)
WBC: 5.5 10*3/uL (ref 3.4–10.8)

## 2020-06-04 LAB — BASIC METABOLIC PANEL
BUN/Creatinine Ratio: 14 (ref 10–24)
BUN: 18 mg/dL (ref 8–27)
CO2: 22 mmol/L (ref 20–29)
Calcium: 10 mg/dL (ref 8.6–10.2)
Chloride: 109 mmol/L — ABNORMAL HIGH (ref 96–106)
Creatinine, Ser: 1.26 mg/dL (ref 0.76–1.27)
GFR calc Af Amer: 66 mL/min/{1.73_m2} (ref >59)
GFR calc non Af Amer: 57 mL/min/{1.73_m2} — ABNORMAL LOW (ref >59)
Glucose: 95 mg/dL (ref 65–99)
Potassium: 4.3 mmol/L (ref 3.5–5.2)
Sodium: 140 mmol/L (ref 134–144)

## 2020-06-04 LAB — LIPID PANEL
Chol/HDL Ratio: 2.4 ratio (ref 0.0–5.0)
Cholesterol, Total: 112 mg/dL (ref 100–199)
HDL: 46 mg/dL (ref >39)
LDL Chol Calc (NIH): 52 mg/dL (ref 0–99)
Triglycerides: 62 mg/dL (ref 0–149)
VLDL Cholesterol Cal: 14 mg/dL (ref 5–40)

## 2020-06-04 LAB — TSH: TSH: 4.98 u[IU]/mL — ABNORMAL HIGH (ref 0.450–4.500)

## 2020-06-17 ENCOUNTER — Other Ambulatory Visit: Payer: Self-pay

## 2020-06-17 ENCOUNTER — Ambulatory Visit (INDEPENDENT_AMBULATORY_CARE_PROVIDER_SITE_OTHER): Payer: Medicare HMO | Admitting: Vascular Surgery

## 2020-06-17 ENCOUNTER — Encounter (INDEPENDENT_AMBULATORY_CARE_PROVIDER_SITE_OTHER): Payer: Self-pay | Admitting: Vascular Surgery

## 2020-06-17 ENCOUNTER — Ambulatory Visit (INDEPENDENT_AMBULATORY_CARE_PROVIDER_SITE_OTHER): Payer: Medicare HMO

## 2020-06-17 VITALS — BP 157/88 | HR 66 | Resp 16 | Wt 199.6 lb

## 2020-06-17 DIAGNOSIS — E782 Mixed hyperlipidemia: Secondary | ICD-10-CM

## 2020-06-17 DIAGNOSIS — I6523 Occlusion and stenosis of bilateral carotid arteries: Secondary | ICD-10-CM

## 2020-06-17 NOTE — Progress Notes (Signed)
MRN : [id number]  Robert Roy is a 74 y.o. (07/09/1945) male who presents with chief complaint of  Chief Complaint  Patient presents with  . Carotid    19yr follow up  .  History of Present Illness: Patient returns in follow-up of his carotid disease.  He is doing well today without any complaints.  He denies any focal neurologic symptoms. Specifically, the patient denies amaurosis fugax, speech or swallowing difficulties, or arm or leg weakness or numbness.  He is status post right carotid endarterectomy.  His duplex today shows a widely patent right carotid endarterectomy and mild left carotid stenosis in the 1 to 39% range.  Current Outpatient Medications  Medication Sig Dispense Refill  . ASPIRIN ADULT LOW STRENGTH 81 MG EC tablet TAKE ONE TABLET BY MOUTH EVERY DAY 90 tablet 2  . clopidogrel (PLAVIX) 75 MG tablet TAKE ONE TABLET BY MOUTH EVERY DAY WITH BREAKFAST 90 tablet 2  . finasteride (PROSCAR) 5 MG tablet Take 5 mg by mouth daily.    . rosuvastatin (CRESTOR) 10 MG tablet Take 1 tablet (10 mg total) by mouth daily. 90 tablet 3  . tamsulosin (FLOMAX) 0.4 MG CAPS capsule Take 1 capsule by mouth daily.     No current facility-administered medications for this visit.    Past Medical History:  Diagnosis Date  . Atherosclerotic vascular disease 01/03/2019  . BMI 28.0-28.9,adult   . Carotid stenosis 08/09/2018  . Carotid stenosis, right 09/28/2018  . History of kidney stones    h/o  . Mixed dyslipidemia 01/03/2019  . PVC (premature ventricular contraction) 01/16/2018  . Status post carotid endarterectomy 01/03/2019    Past Surgical History:  Procedure Laterality Date  . ENDARTERECTOMY Right 09/28/2018   Procedure: ENDARTERECTOMY CAROTID;  Surgeon: Annice Needy, MD;  Location: [facility] ORS;  Service: Vascular;  Laterality: Right;  . I & D EXTREMITY Right 10/14/2018   Procedure: exploration right neck,drainage abscess,drain placement, carotid patch removal,saphenous vein harvest;   Surgeon: Ezequiel Essex, MD;  Location: [facility] ORS;  Service: Vascular;  Laterality: Right;  . KIDNEY STONE SURGERY       Social History   Tobacco Use  . Smoking status: Never Smoker  . Smokeless tobacco: Never Used  Vaping Use  . Vaping Use: Never used  Substance Use Topics  . Alcohol use: Never  . Drug use: Never       Family History  Problem Relation Age of Onset  . Hypertension Mother   . Heart disease Father   . Prostate cancer Father      No Known Allergies   REVIEW OF SYSTEMS (Negative unless checked)  Constitutional: [] Weight loss  [] Fever  [] Chills Cardiac: [] Chest pain   [] Chest pressure   [] Palpitations   [] Shortness of breath when laying flat   [] Shortness of breath at rest   [] Shortness of breath with exertion. Vascular:  [] Pain in legs with walking   [] Pain in legs at rest   [] Pain in legs when laying flat   [] Claudication   [] Pain in feet when walking  [] Pain in feet at rest  [] Pain in feet when laying flat   [] History of DVT   [] Phlebitis   [] Swelling in legs   [] Varicose veins   [] Non-healing ulcers Pulmonary:   [] Uses home oxygen   [] Productive cough   [] Hemoptysis   [] Wheeze  [] COPD   [] Asthma Neurologic:  [] Dizziness  [] Blackouts   [] Seizures   [] History of stroke   [] History of TIA  []   Aphasia   [] Temporary blindness   [] Dysphagia   [] Weakness or numbness in arms   [] Weakness or numbness in legs Musculoskeletal:  [] Arthritis   [] Joint swelling   [] Joint pain   [] Low back pain Hematologic:  [] Easy bruising  [] Easy bleeding   [] Hypercoagulable state   [] Anemic  [] Hepatitis Gastrointestinal:  [] Blood in stool   [] Vomiting blood  [] Gastroesophageal reflux/heartburn   [] Difficulty swallowing. Genitourinary:  [] Chronic kidney disease   [] Difficult urination  [] Frequent urination  [] Burning with urination   [] Blood in urine Skin:  [] Rashes   [] Ulcers   [] Wounds Psychological:  [x] History of anxiety   []  History of major depression.  Physical  Examination  Vitals:   06/17/20 1016  BP: (!) 157/88  Pulse: 66  Resp: 16  Weight: 199 lb 9.6 oz (90.5 kg)   Body mass index is 25.63 kg/m. Gen:  WD/WN, NAD. Appears younger than stated age. Head: [place]/AT, No temporalis wasting. Ear/Nose/Throat: Hearing grossly intact, nares w/o erythema or drainage, trachea midline Eyes: Conjunctiva clear. Sclera non-icteric Neck: Supple.  No bruit  Pulmonary:  Good air movement, equal and clear to auscultation bilaterally.  Cardiac: RRR, No JVD Vascular:  Vessel Right Left  Radial Palpable Palpable           Musculoskeletal: M/S 5/5 throughout.  No deformity or atrophy. No edema. Neurologic: CN 2-12 intact. Sensation grossly intact in extremities.  Symmetrical.  Speech is fluent. Motor exam as listed above. Psychiatric: Judgment intact, Mood & affect appropriate for pt's clinical situation. Dermatologic: No rashes or ulcers noted.  Right CEA incision well healed.     CBC Lab Results  Component Value Date   WBC 5.5 06/03/2020   HGB 13.8 06/03/2020   HCT 40.4 06/03/2020   MCV 93 06/03/2020   PLT 163 06/03/2020    BMET    Component Value Date/Time   NA 140 06/03/2020 0837   K 4.3 06/03/2020 0837   CL 109 (H) 06/03/2020 0837   CO2 22 06/03/2020 0837   GLUCOSE 95 06/03/2020 0837   GLUCOSE 104 (H) 10/16/2018 0338   BUN 18 06/03/2020 0837   CREATININE 1.26 06/03/2020 0837   CALCIUM 10.0 06/03/2020 0837   GFRNONAA 57 (L) 06/03/2020 0837   GFRAA 66 06/03/2020 0837   Estimated Creatinine Clearance: 61.6 mL/min (by C-G formula based on SCr of 1.26 mg/dL).  COAG Lab Results  Component Value Date   INR 1.1 10/14/2018   INR 1.1 09/21/2018    Radiology No results found.   Assessment/Plan Carotid stenosis His duplex today shows a widely patent right carotid endarterectomy and mild left carotid stenosis in the 1 to 39% range.  Doing well.  Continue current medical regimen.  Check in 1 year.  Mixed dyslipidemia lipid control  important in reducing the progression of atherosclerotic disease. Continue statin therapy     , MD  06/17/2020 10:56 AM    This note was created with Dragon medical transcription system.  Any errors from dictation are purely unintentional

## 2020-06-17 NOTE — Assessment & Plan Note (Signed)
lipid control important in reducing the progression of atherosclerotic disease. Continue statin therapy  

## 2020-06-17 NOTE — Assessment & Plan Note (Signed)
His duplex today shows a widely patent right carotid endarterectomy and mild left carotid stenosis in the 1 to 39% range.  Doing well.  Continue current medical regimen.  Check in 1 year.

## 2020-06-23 DIAGNOSIS — E663 Overweight: Secondary | ICD-10-CM | POA: Diagnosis not present

## 2020-06-23 DIAGNOSIS — I6521 Occlusion and stenosis of right carotid artery: Secondary | ICD-10-CM | POA: Diagnosis not present

## 2020-06-23 DIAGNOSIS — E785 Hyperlipidemia, unspecified: Secondary | ICD-10-CM | POA: Diagnosis not present

## 2020-06-23 DIAGNOSIS — Z139 Encounter for screening, unspecified: Secondary | ICD-10-CM | POA: Diagnosis not present

## 2020-06-23 DIAGNOSIS — R42 Dizziness and giddiness: Secondary | ICD-10-CM | POA: Diagnosis not present

## 2020-06-23 DIAGNOSIS — Z1331 Encounter for screening for depression: Secondary | ICD-10-CM | POA: Diagnosis not present

## 2020-06-23 DIAGNOSIS — Z9181 History of falling: Secondary | ICD-10-CM | POA: Diagnosis not present

## 2020-06-23 DIAGNOSIS — H9193 Unspecified hearing loss, bilateral: Secondary | ICD-10-CM | POA: Diagnosis not present

## 2020-06-23 DIAGNOSIS — I10 Essential (primary) hypertension: Secondary | ICD-10-CM | POA: Diagnosis not present

## 2020-06-25 ENCOUNTER — Other Ambulatory Visit: Payer: Self-pay

## 2020-06-25 ENCOUNTER — Ambulatory Visit (INDEPENDENT_AMBULATORY_CARE_PROVIDER_SITE_OTHER): Payer: Medicare HMO

## 2020-06-25 DIAGNOSIS — I6522 Occlusion and stenosis of left carotid artery: Secondary | ICD-10-CM

## 2020-06-25 DIAGNOSIS — Z9889 Other specified postprocedural states: Secondary | ICD-10-CM | POA: Diagnosis not present

## 2020-06-25 DIAGNOSIS — H40013 Open angle with borderline findings, low risk, bilateral: Secondary | ICD-10-CM | POA: Diagnosis not present

## 2020-06-25 DIAGNOSIS — H25013 Cortical age-related cataract, bilateral: Secondary | ICD-10-CM | POA: Diagnosis not present

## 2020-06-25 DIAGNOSIS — H2513 Age-related nuclear cataract, bilateral: Secondary | ICD-10-CM | POA: Diagnosis not present

## 2020-06-25 DIAGNOSIS — H524 Presbyopia: Secondary | ICD-10-CM | POA: Diagnosis not present

## 2020-06-25 NOTE — Progress Notes (Addendum)
Carotid duplex exam performed  Jimmy Rhea Thrun RDCS, RVT 

## 2020-07-17 DIAGNOSIS — H903 Sensorineural hearing loss, bilateral: Secondary | ICD-10-CM | POA: Diagnosis not present

## 2020-07-17 DIAGNOSIS — Z974 Presence of external hearing-aid: Secondary | ICD-10-CM | POA: Diagnosis not present

## 2020-07-17 DIAGNOSIS — Z77122 Contact with and (suspected) exposure to noise: Secondary | ICD-10-CM | POA: Diagnosis not present

## 2020-07-17 DIAGNOSIS — H9313 Tinnitus, bilateral: Secondary | ICD-10-CM | POA: Diagnosis not present

## 2020-08-13 DIAGNOSIS — H903 Sensorineural hearing loss, bilateral: Secondary | ICD-10-CM | POA: Diagnosis not present

## 2020-08-18 DIAGNOSIS — H903 Sensorineural hearing loss, bilateral: Secondary | ICD-10-CM | POA: Diagnosis not present

## 2020-08-27 DIAGNOSIS — Z6826 Body mass index (BMI) 26.0-26.9, adult: Secondary | ICD-10-CM | POA: Diagnosis not present

## 2020-08-27 DIAGNOSIS — I493 Ventricular premature depolarization: Secondary | ICD-10-CM | POA: Diagnosis not present

## 2020-08-27 DIAGNOSIS — I6521 Occlusion and stenosis of right carotid artery: Secondary | ICD-10-CM | POA: Diagnosis not present

## 2020-08-27 DIAGNOSIS — E663 Overweight: Secondary | ICD-10-CM | POA: Diagnosis not present

## 2020-08-27 DIAGNOSIS — N400 Enlarged prostate without lower urinary tract symptoms: Secondary | ICD-10-CM | POA: Diagnosis not present

## 2020-08-27 DIAGNOSIS — M659 Synovitis and tenosynovitis, unspecified: Secondary | ICD-10-CM | POA: Diagnosis not present

## 2020-08-27 DIAGNOSIS — I10 Essential (primary) hypertension: Secondary | ICD-10-CM | POA: Diagnosis not present

## 2020-08-27 DIAGNOSIS — J309 Allergic rhinitis, unspecified: Secondary | ICD-10-CM | POA: Diagnosis not present

## 2020-08-27 DIAGNOSIS — R42 Dizziness and giddiness: Secondary | ICD-10-CM | POA: Diagnosis not present

## 2020-09-01 DIAGNOSIS — R351 Nocturia: Secondary | ICD-10-CM | POA: Diagnosis not present

## 2020-09-01 DIAGNOSIS — N401 Enlarged prostate with lower urinary tract symptoms: Secondary | ICD-10-CM | POA: Diagnosis not present

## 2020-09-10 DIAGNOSIS — I10 Essential (primary) hypertension: Secondary | ICD-10-CM | POA: Diagnosis not present

## 2020-09-10 DIAGNOSIS — R42 Dizziness and giddiness: Secondary | ICD-10-CM | POA: Diagnosis not present

## 2020-09-10 DIAGNOSIS — J309 Allergic rhinitis, unspecified: Secondary | ICD-10-CM | POA: Diagnosis not present

## 2020-09-10 DIAGNOSIS — E663 Overweight: Secondary | ICD-10-CM | POA: Diagnosis not present

## 2020-09-10 DIAGNOSIS — I6521 Occlusion and stenosis of right carotid artery: Secondary | ICD-10-CM | POA: Diagnosis not present

## 2020-09-10 DIAGNOSIS — E785 Hyperlipidemia, unspecified: Secondary | ICD-10-CM | POA: Diagnosis not present

## 2020-09-10 DIAGNOSIS — I493 Ventricular premature depolarization: Secondary | ICD-10-CM | POA: Diagnosis not present

## 2020-09-10 DIAGNOSIS — N400 Enlarged prostate without lower urinary tract symptoms: Secondary | ICD-10-CM | POA: Diagnosis not present

## 2020-09-10 DIAGNOSIS — Z6826 Body mass index (BMI) 26.0-26.9, adult: Secondary | ICD-10-CM | POA: Diagnosis not present

## 2020-10-10 DIAGNOSIS — Z9181 History of falling: Secondary | ICD-10-CM | POA: Diagnosis not present

## 2020-10-10 DIAGNOSIS — E785 Hyperlipidemia, unspecified: Secondary | ICD-10-CM | POA: Diagnosis not present

## 2020-10-10 DIAGNOSIS — Z1331 Encounter for screening for depression: Secondary | ICD-10-CM | POA: Diagnosis not present

## 2020-10-10 DIAGNOSIS — Z0000 Encounter for general adult medical examination without abnormal findings: Secondary | ICD-10-CM | POA: Diagnosis not present

## 2020-10-20 DIAGNOSIS — B9689 Other specified bacterial agents as the cause of diseases classified elsewhere: Secondary | ICD-10-CM | POA: Diagnosis not present

## 2020-10-20 DIAGNOSIS — I10 Essential (primary) hypertension: Secondary | ICD-10-CM | POA: Diagnosis not present

## 2020-10-20 DIAGNOSIS — I493 Ventricular premature depolarization: Secondary | ICD-10-CM | POA: Diagnosis not present

## 2020-10-20 DIAGNOSIS — J208 Acute bronchitis due to other specified organisms: Secondary | ICD-10-CM | POA: Diagnosis not present

## 2020-10-20 DIAGNOSIS — I6521 Occlusion and stenosis of right carotid artery: Secondary | ICD-10-CM | POA: Diagnosis not present

## 2020-10-20 DIAGNOSIS — J309 Allergic rhinitis, unspecified: Secondary | ICD-10-CM | POA: Diagnosis not present

## 2020-10-20 DIAGNOSIS — R42 Dizziness and giddiness: Secondary | ICD-10-CM | POA: Diagnosis not present

## 2020-10-20 DIAGNOSIS — N400 Enlarged prostate without lower urinary tract symptoms: Secondary | ICD-10-CM | POA: Diagnosis not present

## 2020-10-20 DIAGNOSIS — E785 Hyperlipidemia, unspecified: Secondary | ICD-10-CM | POA: Diagnosis not present

## 2020-10-29 ENCOUNTER — Other Ambulatory Visit: Payer: Self-pay | Admitting: Cardiology

## 2020-11-11 ENCOUNTER — Other Ambulatory Visit: Payer: Self-pay

## 2020-11-18 ENCOUNTER — Ambulatory Visit: Payer: Medicare HMO | Admitting: Cardiology

## 2020-11-18 ENCOUNTER — Encounter: Payer: Self-pay | Admitting: Cardiology

## 2020-11-18 ENCOUNTER — Other Ambulatory Visit: Payer: Self-pay

## 2020-11-18 VITALS — BP 144/68 | HR 80 | Ht 74.0 in | Wt 186.6 lb

## 2020-11-18 DIAGNOSIS — Z1329 Encounter for screening for other suspected endocrine disorder: Secondary | ICD-10-CM

## 2020-11-18 DIAGNOSIS — I7090 Unspecified atherosclerosis: Secondary | ICD-10-CM

## 2020-11-18 DIAGNOSIS — E782 Mixed hyperlipidemia: Secondary | ICD-10-CM | POA: Diagnosis not present

## 2020-11-18 DIAGNOSIS — I493 Ventricular premature depolarization: Secondary | ICD-10-CM

## 2020-11-18 DIAGNOSIS — Z9889 Other specified postprocedural states: Secondary | ICD-10-CM

## 2020-11-18 NOTE — Progress Notes (Signed)
Cardiology Office Note:    Date:  11/18/2020   ID:  Robert Roy, DOB [birth year], MRN [id number]  PCP:  Simone Curia, MD  Cardiologist:  Garwin Brothers, MD   Referring MD: Simone Curia, MD    ASSESSMENT:    1. Atherosclerotic vascular disease   2. Status post carotid endarterectomy   3. Mixed dyslipidemia    PLAN:    In order of problems listed above:  1. Atherosclerotic vascular disease: Secondary prevention stressed with the patient.  Importance of compliance with diet medication stressed any vocalized understanding.  He is walking on a regular basis. 2. Mixed dyslipidemia: Diet was emphasized.  Lipids were reviewed and he will have complete blood work today 3. He gives history of vitamin D deficiency so we will do a vitamin D screen.  He complains of some fatigue.  I told him to discuss this with his primary care. 4. Patient will be seen in follow-up appointment in 6 months or earlier if the patient has any concerns    Medication Adjustments/Labs and Tests Ordered: Current medicines are reviewed at length with the patient today.  Concerns regarding medicines are outlined above.  No orders of the defined types were placed in this encounter.  No orders of the defined types were placed in this encounter.    No chief complaint on file.    History of Present Illness:    Robert Roy is a 75 y.o. male.  Patient has past medical history of atherosclerotic vascular disease, carotid artery stenosis and endarterectomy and mixed dyslipidemia.  He denies any problems at this time and takes care of activities of daily living.  No chest pain orthopnea or PND.  He walks on a regular basis without any symptoms.  At the time of my evaluation, the patient is alert awake oriented and in no distress.  Past Medical History:  Diagnosis Date  . Atherosclerotic vascular disease 01/03/2019  . BMI 28.0-28.9,adult   . Carotid stenosis 08/09/2018  . Carotid stenosis, right 09/28/2018  . History of  kidney stones    h/o  . Mixed dyslipidemia 01/03/2019  . PVC (premature ventricular contraction) 01/16/2018  . Status post carotid endarterectomy 01/03/2019    Past Surgical History:  Procedure Laterality Date  . ENDARTERECTOMY Right 09/28/2018   Procedure: ENDARTERECTOMY CAROTID;  Surgeon: Annice Needy, MD;  Location: [facility] ORS;  Service: Vascular;  Laterality: Right;  . I & D EXTREMITY Right 10/14/2018   Procedure: exploration right neck,drainage abscess,drain placement, carotid patch removal,saphenous vein harvest;  Surgeon: Ezequiel Essex, MD;  Location: [facility] ORS;  Service: Vascular;  Laterality: Right;  . KIDNEY STONE SURGERY      Current Medications: Current Meds  Medication Sig  . aspirin EC 81 MG tablet Take 81 mg by mouth daily. Swallow whole.  . clopidogrel (PLAVIX) 75 MG tablet Take 75 mg by mouth daily.  . finasteride (PROSCAR) 5 MG tablet Take 5 mg by mouth daily.  [name] lisinopril-hydrochlorothiazide (ZESTORETIC) 10-12.5 MG tablet Take 1 tablet by mouth daily.  . rosuvastatin (CRESTOR) 10 MG tablet Take 1 tablet (10 mg total) by mouth daily.  . tamsulosin (FLOMAX) 0.4 MG CAPS capsule Take 1 capsule by mouth daily.     Allergies:   Patient has no known allergies.   Social History   Socioeconomic History  . Marital status: Married    Spouse name: Not on file  . Number of children: Not on file  . Years of education: Not on  file  . Highest education level: Not on file  Occupational History  . Not on file  Tobacco Use  . Smoking status: Never Smoker  . Smokeless tobacco: Never Used  Vaping Use  . Vaping Use: Never used  Substance and Sexual Activity  . Alcohol use: Never  . Drug use: Never  . Sexual activity: Not on file  Other Topics Concern  . Not on file  Social History Narrative  . Not on file   Social Determinants of Health   Financial Resource Strain: Not on file  Food Insecurity: Not on file  Transportation Needs: Not on file  Physical Activity: Not on  file  Stress: Not on file  Social Connections: Not on file     Family History: The patient's family history includes Heart disease in his father; Hypertension in his mother; Prostate cancer in his father.  ROS:   Please see the history of present illness.    All other systems reviewed and are negative.  EKGs/Labs/Other Studies Reviewed:    The following studies were reviewed today: I discussed my findings with the patient at length.  EKG reveals sinus rhythm PACs and nonspecific ST-T changes   Recent Labs: 06/03/2020: ALT 16; BUN 18; Creatinine, Ser 1.26; Hemoglobin 13.8; Platelets 163; Potassium 4.3; Sodium 140; TSH 4.980  Recent Lipid Panel    Component Value Date/Time   CHOL 112 06/03/2020 0837   TRIG 62 06/03/2020 0837   HDL 46 06/03/2020 0837   CHOLHDL 2.4 06/03/2020 0837   LDLCALC 52 06/03/2020 0837    Physical Exam:    VS:  BP (!) 144/68 (BP Location: Left Arm, Patient Position: Sitting, Cuff Size: Normal)   Pulse 80   Ht 6\' 2"  (1.88 m)   Wt 186 lb 9.6 oz (84.6 kg)   SpO2 91%   BMI 23.96 kg/m     Wt Readings from Last 3 Encounters:  11/18/20 186 lb 9.6 oz (84.6 kg)  06/17/20 199 lb 9.6 oz (90.5 kg)  05/27/20 198 lb 6.4 oz (90 kg)     GEN: Patient is in no acute distress HEENT: Normal NECK: No JVD; No carotid bruits LYMPHATICS: No lymphadenopathy CARDIAC: Hear sounds regular, 2/6 systolic murmur at the apex. RESPIRATORY:  Clear to auscultation without rales, wheezing or rhonchi  ABDOMEN: Soft, non-tender, non-distended MUSCULOSKELETAL:  No edema; No deformity  SKIN: Warm and dry NEUROLOGIC:  Alert and oriented x 3 PSYCHIATRIC:  Normal affect   Signed, 05/29/20, MD  11/18/2020 9:30 AM    [facility]

## 2020-11-18 NOTE — Patient Instructions (Signed)
Medication Instructions:  No medication changes. *If you need a refill on your cardiac medications before your next appointment, please call your pharmacy*   Lab Work: Your physician recommends that you have labs done in the office today. Your test included  basic metabolic panel, complete blood count, TSH, vitamin D, liver function and lipids.  If you have labs (blood work) drawn today and your tests are completely normal, you will receive your results only by: MyChart Message (if you have MyChart) OR A paper copy in the mail If you have any lab test that is abnormal or we need to change your treatment, we will call you to review the results.   Testing/Procedures: None ordered   Follow-Up: At [facility], you and your health needs are our priority.  As part of our continuing mission to provide you with exceptional heart care, we have created designated Provider Care Teams.  These Care Teams include your primary Cardiologist (physician) and Advanced Practice Providers (APPs -  Physician Assistants and Nurse Practitioners) who all work together to provide you with the care you need, when you need it.  We recommend signing up for the patient portal called "MyChart".  Sign up information is provided on this After Visit Summary.  MyChart is used to connect with patients for Virtual Visits (Telemedicine).  Patients are able to view lab/test results, encounter notes, upcoming appointments, etc.  Non-urgent messages can be sent to your provider as well.   To learn more about what you can do with MyChart, go to [email or web address].    Your next appointment:   6 month(s)  The format for your next appointment:   In Person  Provider:   Rajan Revankar, MD   Other Instructions NA  

## 2020-11-19 LAB — CBC WITH DIFFERENTIAL/PLATELET
Basophils Absolute: 0 10*3/uL (ref 0.0–0.2)
Basos: 1 %
EOS (ABSOLUTE): 0.2 10*3/uL (ref 0.0–0.4)
Eos: 3 %
Hematocrit: 41.1 % (ref 37.5–51.0)
Hemoglobin: 13.8 g/dL (ref 13.0–17.7)
Immature Grans (Abs): 0 10*3/uL (ref 0.0–0.1)
Immature Granulocytes: 0 %
Lymphocytes Absolute: 1.6 10*3/uL (ref 0.7–3.1)
Lymphs: 25 %
MCH: 31.2 pg (ref 26.6–33.0)
MCHC: 33.6 g/dL (ref 31.5–35.7)
MCV: 93 fL (ref 79–97)
Monocytes Absolute: 0.4 10*3/uL (ref 0.1–0.9)
Monocytes: 6 %
Neutrophils Absolute: 4 10*3/uL (ref 1.4–7.0)
Neutrophils: 65 %
Platelets: 180 10*3/uL (ref 150–450)
RBC: 4.43 x10E6/uL (ref 4.14–5.80)
RDW: 13.6 % (ref 11.6–15.4)
WBC: 6.2 10*3/uL (ref 3.4–10.8)

## 2020-11-19 LAB — LIPID PANEL
Chol/HDL Ratio: 2 ratio (ref 0.0–5.0)
Cholesterol, Total: 126 mg/dL (ref 100–199)
HDL: 62 mg/dL (ref >39)
LDL Chol Calc (NIH): 53 mg/dL (ref 0–99)
Triglycerides: 46 mg/dL (ref 0–149)
VLDL Cholesterol Cal: 11 mg/dL (ref 5–40)

## 2020-11-19 LAB — BASIC METABOLIC PANEL
BUN/Creatinine Ratio: 18 (ref 10–24)
BUN: 24 mg/dL (ref 8–27)
CO2: 23 mmol/L (ref 20–29)
Calcium: 10.9 mg/dL — ABNORMAL HIGH (ref 8.6–10.2)
Chloride: 105 mmol/L (ref 96–106)
Creatinine, Ser: 1.3 mg/dL — ABNORMAL HIGH (ref 0.76–1.27)
Glucose: 95 mg/dL (ref 65–99)
Potassium: 4.6 mmol/L (ref 3.5–5.2)
Sodium: 140 mmol/L (ref 134–144)
eGFR: 58 mL/min/{1.73_m2} — ABNORMAL LOW (ref >59)

## 2020-11-19 LAB — HEPATIC FUNCTION PANEL
ALT: 25 IU/L (ref 0–44)
AST: 22 IU/L (ref 0–40)
Albumin: 4.3 g/dL (ref 3.7–4.7)
Alkaline Phosphatase: 73 IU/L (ref 44–121)
Bilirubin Total: 0.6 mg/dL (ref 0.0–1.2)
Bilirubin, Direct: 0.18 mg/dL (ref 0.00–0.40)
Total Protein: 6.3 g/dL (ref 6.0–8.5)

## 2020-11-19 LAB — TSH: TSH: 4.28 u[IU]/mL (ref 0.450–4.500)

## 2020-11-19 LAB — VITAMIN D 25 HYDROXY (VIT D DEFICIENCY, FRACTURES): Vit D, 25-Hydroxy: 39.9 ng/mL (ref 30.0–100.0)

## 2021-01-12 ENCOUNTER — Other Ambulatory Visit (INDEPENDENT_AMBULATORY_CARE_PROVIDER_SITE_OTHER): Payer: Self-pay | Admitting: Vascular Surgery

## 2021-01-15 DIAGNOSIS — H524 Presbyopia: Secondary | ICD-10-CM | POA: Diagnosis not present

## 2021-01-15 DIAGNOSIS — H2513 Age-related nuclear cataract, bilateral: Secondary | ICD-10-CM | POA: Diagnosis not present

## 2021-01-15 DIAGNOSIS — H25013 Cortical age-related cataract, bilateral: Secondary | ICD-10-CM | POA: Diagnosis not present

## 2021-01-15 DIAGNOSIS — H40013 Open angle with borderline findings, low risk, bilateral: Secondary | ICD-10-CM | POA: Diagnosis not present

## 2021-03-04 DIAGNOSIS — N401 Enlarged prostate with lower urinary tract symptoms: Secondary | ICD-10-CM | POA: Diagnosis not present

## 2021-03-04 DIAGNOSIS — R351 Nocturia: Secondary | ICD-10-CM | POA: Diagnosis not present

## 2021-03-24 DIAGNOSIS — R42 Dizziness and giddiness: Secondary | ICD-10-CM | POA: Diagnosis not present

## 2021-03-24 DIAGNOSIS — I10 Essential (primary) hypertension: Secondary | ICD-10-CM | POA: Diagnosis not present

## 2021-03-24 DIAGNOSIS — N400 Enlarged prostate without lower urinary tract symptoms: Secondary | ICD-10-CM | POA: Diagnosis not present

## 2021-03-24 DIAGNOSIS — E785 Hyperlipidemia, unspecified: Secondary | ICD-10-CM | POA: Diagnosis not present

## 2021-03-24 DIAGNOSIS — I493 Ventricular premature depolarization: Secondary | ICD-10-CM | POA: Diagnosis not present

## 2021-03-24 DIAGNOSIS — K5909 Other constipation: Secondary | ICD-10-CM | POA: Diagnosis not present

## 2021-03-24 DIAGNOSIS — J309 Allergic rhinitis, unspecified: Secondary | ICD-10-CM | POA: Diagnosis not present

## 2021-03-24 DIAGNOSIS — M25512 Pain in left shoulder: Secondary | ICD-10-CM | POA: Diagnosis not present

## 2021-03-24 DIAGNOSIS — I6521 Occlusion and stenosis of right carotid artery: Secondary | ICD-10-CM | POA: Diagnosis not present

## 2021-03-24 DIAGNOSIS — I739 Peripheral vascular disease, unspecified: Secondary | ICD-10-CM | POA: Diagnosis not present

## 2021-04-13 ENCOUNTER — Other Ambulatory Visit: Payer: Self-pay | Admitting: Cardiology

## 2021-04-22 DIAGNOSIS — K5909 Other constipation: Secondary | ICD-10-CM | POA: Diagnosis not present

## 2021-04-22 DIAGNOSIS — F419 Anxiety disorder, unspecified: Secondary | ICD-10-CM | POA: Diagnosis not present

## 2021-04-22 DIAGNOSIS — I493 Ventricular premature depolarization: Secondary | ICD-10-CM | POA: Diagnosis not present

## 2021-04-22 DIAGNOSIS — E785 Hyperlipidemia, unspecified: Secondary | ICD-10-CM | POA: Diagnosis not present

## 2021-04-22 DIAGNOSIS — I10 Essential (primary) hypertension: Secondary | ICD-10-CM | POA: Diagnosis not present

## 2021-04-22 DIAGNOSIS — R42 Dizziness and giddiness: Secondary | ICD-10-CM | POA: Diagnosis not present

## 2021-04-22 DIAGNOSIS — J309 Allergic rhinitis, unspecified: Secondary | ICD-10-CM | POA: Diagnosis not present

## 2021-04-22 DIAGNOSIS — N400 Enlarged prostate without lower urinary tract symptoms: Secondary | ICD-10-CM | POA: Diagnosis not present

## 2021-04-22 DIAGNOSIS — I6521 Occlusion and stenosis of right carotid artery: Secondary | ICD-10-CM | POA: Diagnosis not present

## 2021-06-16 ENCOUNTER — Ambulatory Visit (INDEPENDENT_AMBULATORY_CARE_PROVIDER_SITE_OTHER): Payer: Medicare HMO | Admitting: Vascular Surgery

## 2021-06-16 ENCOUNTER — Encounter (INDEPENDENT_AMBULATORY_CARE_PROVIDER_SITE_OTHER): Payer: Medicare HMO

## 2021-06-22 ENCOUNTER — Other Ambulatory Visit (INDEPENDENT_AMBULATORY_CARE_PROVIDER_SITE_OTHER): Payer: Self-pay | Admitting: Vascular Surgery

## 2021-06-22 DIAGNOSIS — I779 Disorder of arteries and arterioles, unspecified: Secondary | ICD-10-CM

## 2021-06-23 ENCOUNTER — Encounter (INDEPENDENT_AMBULATORY_CARE_PROVIDER_SITE_OTHER): Payer: Self-pay | Admitting: Vascular Surgery

## 2021-06-23 ENCOUNTER — Other Ambulatory Visit: Payer: Self-pay

## 2021-06-23 ENCOUNTER — Ambulatory Visit (INDEPENDENT_AMBULATORY_CARE_PROVIDER_SITE_OTHER): Payer: Medicare HMO

## 2021-06-23 ENCOUNTER — Ambulatory Visit (INDEPENDENT_AMBULATORY_CARE_PROVIDER_SITE_OTHER): Payer: Medicare HMO | Admitting: Vascular Surgery

## 2021-06-23 VITALS — BP 169/75 | HR 73 | Resp 16 | Wt 186.0 lb

## 2021-06-23 DIAGNOSIS — I6523 Occlusion and stenosis of bilateral carotid arteries: Secondary | ICD-10-CM

## 2021-06-23 DIAGNOSIS — E782 Mixed hyperlipidemia: Secondary | ICD-10-CM | POA: Diagnosis not present

## 2021-06-23 DIAGNOSIS — I779 Disorder of arteries and arterioles, unspecified: Secondary | ICD-10-CM | POA: Diagnosis not present

## 2021-06-23 NOTE — Assessment & Plan Note (Signed)
lipid control important in reducing the progression of atherosclerotic disease. Continue statin therapy  

## 2021-06-23 NOTE — Assessment & Plan Note (Signed)
Duplex today shows a widely patent right carotid endarterectomy in 1 to 39% left ICA stenosis.  No progression from previous studies.  Continue current medical regimen.  Recheck in 1 year

## 2021-06-23 NOTE — Progress Notes (Signed)
MRN : [id number]  Robert Roy is a 75 y.o. (03/19/1946) male who presents with chief complaint of  Chief Complaint  Patient presents with   Follow-up    Ultrasound follow up  .  History of Present Illness: Patient returns in follow-up of his carotid disease.  He is doing well today.  He denies any complaints.  No focal neurologic symptoms. Specifically, the patient denies amaurosis fugax, speech or swallowing difficulties, or arm or leg weakness or numbness. Duplex today shows a widely patent right carotid endarterectomy in 1 to 39% left ICA stenosis  Current Outpatient Medications  Medication Sig Dispense Refill   clopidogrel (PLAVIX) 75 MG tablet TAKE ONE TABLET BY MOUTH EVERY DAY WITH BREAKFAST 90 tablet 2   finasteride (PROSCAR) 5 MG tablet Take 5 mg by mouth daily.     GOODSENSE ASPIRIN LOW DOSE 81 MG EC tablet TAKE ONE TABLET BY MOUTH EVERY DAY 90 tablet 2   lisinopril-hydrochlorothiazide (ZESTORETIC) 10-12.5 MG tablet Take 1 tablet by mouth daily.     rosuvastatin (CRESTOR) 10 MG tablet Take 1 tablet (10 mg total) by mouth daily. 90 tablet 1   tamsulosin (FLOMAX) 0.4 MG CAPS capsule Take 1 capsule by mouth daily.     No current facility-administered medications for this visit.    Past Medical History:  Diagnosis Date   Atherosclerotic vascular disease 01/03/2019   BMI 28.0-28.9,adult    Carotid stenosis 08/09/2018   Carotid stenosis, right 09/28/2018   History of kidney stones    h/o   Mixed dyslipidemia 01/03/2019   PVC (premature ventricular contraction) 01/16/2018   Status post carotid endarterectomy 01/03/2019    Past Surgical History:  Procedure Laterality Date   ENDARTERECTOMY Right 09/28/2018   Procedure: ENDARTERECTOMY CAROTID;  Surgeon: Annice Needy, MD;  Location: [facility] ORS;  Service: Vascular;  Laterality: Right;   I & D EXTREMITY Right 10/14/2018   Procedure: exploration right neck,drainage abscess,drain placement, carotid patch removal,saphenous vein harvest;   Surgeon: Ezequiel Essex, MD;  Location: [facility] ORS;  Service: Vascular;  Laterality: Right;   KIDNEY STONE SURGERY       Social History   Tobacco Use   Smoking status: Never   Smokeless tobacco: Never  Vaping Use   Vaping Use: Never used  Substance Use Topics   Alcohol use: Never   Drug use: Never      Family History  Problem Relation Age of Onset   Hypertension Mother    Heart disease Father    Prostate cancer Father      No Known Allergies   REVIEW OF SYSTEMS (Negative unless checked)  Constitutional: [] Weight loss  [] Fever  [] Chills Cardiac: [] Chest pain   [] Chest pressure   [] Palpitations   [] Shortness of breath when laying flat   [] Shortness of breath at rest   [] Shortness of breath with exertion. Vascular:  [] Pain in legs with walking   [] Pain in legs at rest   [] Pain in legs when laying flat   [] Claudication   [] Pain in feet when walking  [] Pain in feet at rest  [] Pain in feet when laying flat   [] History of DVT   [] Phlebitis   [] Swelling in legs   [] Varicose veins   [] Non-healing ulcers Pulmonary:   [] Uses home oxygen   [] Productive cough   [] Hemoptysis   [] Wheeze  [] COPD   [] Asthma Neurologic:  [] Dizziness  [] Blackouts   [] Seizures   [] History of stroke   [] History of TIA  [] Aphasia   []   Temporary blindness   [] Dysphagia   [] Weakness or numbness in arms   [] Weakness or numbness in legs Musculoskeletal:  [] Arthritis   [] Joint swelling   [] Joint pain   [] Low back pain Hematologic:  [] Easy bruising  [] Easy bleeding   [] Hypercoagulable state   [] Anemic  [] Hepatitis Gastrointestinal:  [] Blood in stool   [] Vomiting blood  [] Gastroesophageal reflux/heartburn   [] Difficulty swallowing. Genitourinary:  [] Chronic kidney disease   [] Difficult urination  [] Frequent urination  [] Burning with urination   [] Blood in urine Skin:  [] Rashes   [] Ulcers   [] Wounds Psychological:  [x] History of anxiety   []  History of major depression.  Physical Examination  Vitals:   06/23/21 1549   BP: (!) 169/75  Pulse: 73  Resp: 16  Weight: 186 lb (84.4 kg)   Body mass index is 23.88 kg/m. Gen:  WD/WN, NAD Head: [place]/AT, No temporalis wasting. Ear/Nose/Throat: Hearing grossly intact, nares w/o erythema or drainage, trachea midline Eyes: Conjunctiva clear. Sclera non-icteric Neck: Supple.  No bruit  Pulmonary:  Good air movement, equal and clear to auscultation bilaterally.  Cardiac: RRR, No JVD Vascular:  Vessel Right Left  Radial Palpable Palpable           Musculoskeletal: M/S 5/5 throughout.  No deformity or atrophy. No edema. Neurologic: CN 2-12 intact. Sensation grossly intact in extremities.  Symmetrical.  Speech is fluent. Motor exam as listed above. Psychiatric: Judgment intact, Mood & affect appropriate for pt's clinical situation. Dermatologic: No rashes or ulcers noted.  No cellulitis or open wounds.     CBC Lab Results  Component Value Date   WBC 6.2 11/18/2020   HGB 13.8 11/18/2020   HCT 41.1 11/18/2020   MCV 93 11/18/2020   PLT 180 11/18/2020    BMET    Component Value Date/Time   NA 140 11/18/2020 0936   K 4.6 11/18/2020 0936   CL 105 11/18/2020 0936   CO2 23 11/18/2020 0936   GLUCOSE 95 11/18/2020 0936   GLUCOSE 104 (H) 10/16/2018 0338   BUN 24 11/18/2020 0936   CREATININE 1.30 (H) 11/18/2020 0936   CALCIUM 10.9 (H) 11/18/2020 0936   GFRNONAA 57 (L) 06/03/2020 0837   GFRAA 66 06/03/2020 0837   CrCl cannot be calculated (Patient's most recent lab result is older than the maximum 21 days allowed.).  COAG Lab Results  Component Value Date   INR 1.1 10/14/2018   INR 1.1 09/21/2018    Radiology No results found.   Assessment/Plan Mixed dyslipidemia lipid control important in reducing the progression of atherosclerotic disease. Continue statin therapy   Carotid stenosis Duplex today shows a widely patent right carotid endarterectomy in 1 to 39% left ICA stenosis.  No progression from previous studies.  Continue current medical  regimen.  Recheck in 1 year    , MD  06/23/2021 4:07 PM    This note was created with Dragon medical transcription system.  Any errors from dictation are purely unintentional

## 2021-07-09 ENCOUNTER — Ambulatory Visit: Payer: Medicare HMO | Admitting: Cardiology

## 2021-07-09 ENCOUNTER — Encounter: Payer: Self-pay | Admitting: Cardiology

## 2021-07-09 ENCOUNTER — Other Ambulatory Visit: Payer: Self-pay

## 2021-07-09 VITALS — BP 140/68 | HR 66 | Ht 74.0 in | Wt 195.0 lb

## 2021-07-09 DIAGNOSIS — I493 Ventricular premature depolarization: Secondary | ICD-10-CM

## 2021-07-09 DIAGNOSIS — Z9889 Other specified postprocedural states: Secondary | ICD-10-CM

## 2021-07-09 DIAGNOSIS — E782 Mixed hyperlipidemia: Secondary | ICD-10-CM

## 2021-07-09 DIAGNOSIS — I7090 Unspecified atherosclerosis: Secondary | ICD-10-CM

## 2021-07-09 NOTE — Patient Instructions (Addendum)
Medication Instructions:  Your physician recommends that you continue on your current medications as directed. Please refer to the Current Medication list given to you today.  *If you need a refill on your cardiac medications before your next appointment, please call your pharmacy*   Lab Work: Your physician recommends that you return for lab work while fasting CBC, BMET, TSH, LFT's, Lipids  If you have labs (blood work) drawn today and your tests are completely normal, you will receive your results only by: MyChart Message (if you have MyChart) OR A paper copy in the mail If you have any lab test that is abnormal or we need to change your treatment, we will call you to review the results.   Testing/Procedures: NONE  Follow-Up: At [facility], you and your health needs are our priority.  As part of our continuing mission to provide you with exceptional heart care, we have created designated Provider Care Teams.  These Care Teams include your primary Cardiologist (physician) and Advanced Practice Providers (APPs -  Physician Assistants and Nurse Practitioners) who all work together to provide you with the care you need, when you need it.  We recommend signing up for the patient portal called "MyChart".  Sign up information is provided on this After Visit Summary.  MyChart is used to connect with patients for Virtual Visits (Telemedicine).  Patients are able to view lab/test results, encounter notes, upcoming appointments, etc.  Non-urgent messages can be sent to your provider as well.   To learn more about what you can do with MyChart, go to [email or web address].    Your next appointment:   9 month(s)  The format for your next appointment:   In Person  Provider:   Belva Crome, MD    Other Instructions

## 2021-07-09 NOTE — Addendum Note (Signed)
Addended by: Jerl Santos R on: 07/09/2021 02:51 PM   Modules accepted: Orders

## 2021-07-09 NOTE — Addendum Note (Signed)
Addended by: Roosvelt Harps R on: 07/09/2021 02:44 PM   Modules accepted: Orders

## 2021-07-09 NOTE — Progress Notes (Signed)
Cardiology Office Note:    Date:  07/09/2021   ID:  Robert Roy, DOB [birth year], MRN [id number]  PCP:  Cher Nakai, MD  Cardiologist:  Jenean Lindau, MD   Referring MD: Cher Nakai, MD    ASSESSMENT:    1. PVC (premature ventricular contraction)   2. Atherosclerotic vascular disease   3. Status post carotid endarterectomy   4. Mixed dyslipidemia    PLAN:    In order of problems listed above:  Atherosclerotic vascular disease: Secondary prevention stressed with the patient.  Importance of compliance with diet medication stressed and he vocalized understanding.  He was advised to walk at least half an hour a day 5 days a week and he promises to do so. Carotid stenosis post endarterectomy: We will do a carotid Doppler to assess this.  Especially the right side internal carotid artery.  He has a bruit on the right side. Mixed dyslipidemia: Diet was emphasized.  He was advised to come back in the next few days for complete blood work. PVCs: Stable at this time.  Clinically appear to have resolved.  I reassured him about this. Patient will be seen in follow-up appointment in 6 months or earlier if the patient has any concerns    Medication Adjustments/Labs and Tests Ordered: Current medicines are reviewed at length with the patient today.  Concerns regarding medicines are outlined above.  No orders of the defined types were placed in this encounter.  No orders of the defined types were placed in this encounter.    Chief Complaint  Patient presents with   Follow-up     History of Present Illness:    Robert Roy is a 75 y.o. male.  Patient has past medical history of atherosclerotic vascular disease and is post carotid endarterectomy, mixed dyslipidemia and history of PVCs.  He denies any problems at this time and takes care of activities of daily living.  No chest pain orthopnea or PND.  At the time of my evaluation, the patient is alert awake oriented and in no  distress.  Past Medical History:  Diagnosis Date   Atherosclerotic vascular disease 01/03/2019   BMI 28.0-28.9,adult    Carotid stenosis 08/09/2018   Carotid stenosis, right 09/28/2018   History of kidney stones    h/o   Mixed dyslipidemia 01/03/2019   PVC (premature ventricular contraction) 01/16/2018   Status post carotid endarterectomy 01/03/2019    Past Surgical History:  Procedure Laterality Date   ENDARTERECTOMY Right 09/28/2018   Procedure: ENDARTERECTOMY CAROTID;  Surgeon: Algernon Huxley, MD;  Location: [facility] ORS;  Service: Vascular;  Laterality: Right;   I & D EXTREMITY Right 10/14/2018   Procedure: exploration right neck,drainage abscess,drain placement, carotid patch removal,saphenous vein harvest;  Surgeon: Shelda Altes, MD;  Location: [facility] ORS;  Service: Vascular;  Laterality: Right;   KIDNEY STONE SURGERY      Current Medications: Current Meds  Medication Sig   clopidogrel (PLAVIX) 75 MG tablet TAKE ONE TABLET BY MOUTH EVERY DAY WITH BREAKFAST   finasteride (PROSCAR) 5 MG tablet Take 5 mg by mouth daily.   GOODSENSE ASPIRIN LOW DOSE 81 MG EC tablet TAKE ONE TABLET BY MOUTH EVERY DAY   rosuvastatin (CRESTOR) 10 MG tablet Take 1 tablet (10 mg total) by mouth daily.   tamsulosin (FLOMAX) 0.4 MG CAPS capsule Take 1 capsule by mouth daily.     Allergies:   Patient has no known allergies.   Social History   Socioeconomic History  Marital status: Married    Spouse name: Not on file   Number of children: Not on file   Years of education: Not on file   Highest education level: Not on file  Occupational History   Not on file  Tobacco Use   Smoking status: Never   Smokeless tobacco: Never  Vaping Use   Vaping Use: Never used  Substance and Sexual Activity   Alcohol use: Never   Drug use: Never   Sexual activity: Not on file  Other Topics Concern   Not on file  Social History Narrative   Not on file   Social Determinants of Health   Financial Resource Strain: Not  on file  Food Insecurity: Not on file  Transportation Needs: Not on file  Physical Activity: Not on file  Stress: Not on file  Social Connections: Not on file     Family History: The patient's family history includes Heart disease in his father; Hypertension in his mother; Prostate cancer in his father.  ROS:   Please see the history of present illness.    All other systems reviewed and are negative.  EKGs/Labs/Other Studies Reviewed:    The following studies were reviewed today: EKG was sinus, nonspecific ST-T changes   Recent Labs: 11/18/2020: ALT 25; BUN 24; Creatinine, Ser 1.30; Hemoglobin 13.8; Platelets 180; Potassium 4.6; Sodium 140; TSH 4.280  Recent Lipid Panel    Component Value Date/Time   CHOL 126 11/18/2020 0936   TRIG 46 11/18/2020 0936   HDL 62 11/18/2020 0936   CHOLHDL 2.0 11/18/2020 0936   LDLCALC 53 11/18/2020 0936    Physical Exam:    VS:  BP 140/68 (BP Location: Right Arm, Patient Position: Sitting, Cuff Size: Normal)    Pulse 66    Ht 6\' 2"  (1.88 m)    Wt 195 lb (88.5 kg)    SpO2 96%    BMI 25.04 kg/m     Wt Readings from Last 3 Encounters:  07/09/21 195 lb (88.5 kg)  06/23/21 186 lb (84.4 kg)  11/18/20 186 lb 9.6 oz (84.6 kg)     GEN: Patient is in no acute distress HEENT: Normal NECK: No JVD; No carotid bruits LYMPHATICS: No lymphadenopathy CARDIAC: Hear sounds regular, 2/6 systolic murmur at the apex. RESPIRATORY:  Clear to auscultation without rales, wheezing or rhonchi  ABDOMEN: Soft, non-tender, non-distended MUSCULOSKELETAL:  No edema; No deformity  SKIN: Warm and dry NEUROLOGIC:  Alert and oriented x 3 PSYCHIATRIC:  Normal affect   Signed, Jenean Lindau, MD  07/09/2021 2:29 PM    [place]

## 2021-07-28 DIAGNOSIS — Z9889 Other specified postprocedural states: Secondary | ICD-10-CM | POA: Diagnosis not present

## 2021-07-28 DIAGNOSIS — I493 Ventricular premature depolarization: Secondary | ICD-10-CM | POA: Diagnosis not present

## 2021-07-28 DIAGNOSIS — E782 Mixed hyperlipidemia: Secondary | ICD-10-CM | POA: Diagnosis not present

## 2021-07-28 DIAGNOSIS — I7090 Unspecified atherosclerosis: Secondary | ICD-10-CM | POA: Diagnosis not present

## 2021-07-29 LAB — HEPATIC FUNCTION PANEL
ALT: 16 IU/L (ref 0–44)
AST: 20 IU/L (ref 0–40)
Albumin: 4.3 g/dL (ref 3.7–4.7)
Alkaline Phosphatase: 85 IU/L (ref 44–121)
Bilirubin Total: 0.5 mg/dL (ref 0.0–1.2)
Bilirubin, Direct: 0.18 mg/dL (ref 0.00–0.40)
Total Protein: 6.4 g/dL (ref 6.0–8.5)

## 2021-07-29 LAB — BASIC METABOLIC PANEL
BUN/Creatinine Ratio: 15 (ref 10–24)
BUN: 20 mg/dL (ref 8–27)
CO2: 26 mmol/L (ref 20–29)
Calcium: 10.5 mg/dL — ABNORMAL HIGH (ref 8.6–10.2)
Chloride: 108 mmol/L — ABNORMAL HIGH (ref 96–106)
Creatinine, Ser: 1.32 mg/dL — ABNORMAL HIGH (ref 0.76–1.27)
Glucose: 88 mg/dL (ref 70–99)
Potassium: 4.3 mmol/L (ref 3.5–5.2)
Sodium: 143 mmol/L (ref 134–144)
eGFR: 57 mL/min/{1.73_m2} — ABNORMAL LOW (ref >59)

## 2021-07-29 LAB — LIPID PANEL
Chol/HDL Ratio: 2.2 ratio (ref 0.0–5.0)
Cholesterol, Total: 126 mg/dL (ref 100–199)
HDL: 58 mg/dL (ref >39)
LDL Chol Calc (NIH): 58 mg/dL (ref 0–99)
Triglycerides: 41 mg/dL (ref 0–149)
VLDL Cholesterol Cal: 10 mg/dL (ref 5–40)

## 2021-07-29 LAB — CBC
Hematocrit: 42.9 % (ref 37.5–51.0)
Hemoglobin: 14.6 g/dL (ref 13.0–17.7)
MCH: 31.1 pg (ref 26.6–33.0)
MCHC: 34 g/dL (ref 31.5–35.7)
MCV: 92 fL (ref 79–97)
Platelets: 168 10*3/uL (ref 150–450)
RBC: 4.69 x10E6/uL (ref 4.14–5.80)
RDW: 12.4 % (ref 11.6–15.4)
WBC: 5.6 10*3/uL (ref 3.4–10.8)

## 2021-07-29 LAB — TSH: TSH: 5.27 u[IU]/mL — ABNORMAL HIGH (ref 0.450–4.500)

## 2021-09-24 ENCOUNTER — Other Ambulatory Visit (INDEPENDENT_AMBULATORY_CARE_PROVIDER_SITE_OTHER): Payer: Self-pay | Admitting: Vascular Surgery

## 2021-09-25 DIAGNOSIS — Z125 Encounter for screening for malignant neoplasm of prostate: Secondary | ICD-10-CM | POA: Diagnosis not present

## 2021-09-25 DIAGNOSIS — N401 Enlarged prostate with lower urinary tract symptoms: Secondary | ICD-10-CM | POA: Diagnosis not present

## 2021-09-25 DIAGNOSIS — R351 Nocturia: Secondary | ICD-10-CM | POA: Diagnosis not present

## 2021-10-12 ENCOUNTER — Other Ambulatory Visit: Payer: Self-pay | Admitting: Cardiology

## 2021-10-12 ENCOUNTER — Other Ambulatory Visit (INDEPENDENT_AMBULATORY_CARE_PROVIDER_SITE_OTHER): Payer: Self-pay | Admitting: Vascular Surgery

## 2021-11-24 DIAGNOSIS — I6521 Occlusion and stenosis of right carotid artery: Secondary | ICD-10-CM | POA: Diagnosis not present

## 2021-11-24 DIAGNOSIS — Z1331 Encounter for screening for depression: Secondary | ICD-10-CM | POA: Diagnosis not present

## 2021-11-24 DIAGNOSIS — E785 Hyperlipidemia, unspecified: Secondary | ICD-10-CM | POA: Diagnosis not present

## 2021-11-24 DIAGNOSIS — Z9181 History of falling: Secondary | ICD-10-CM | POA: Diagnosis not present

## 2021-11-24 DIAGNOSIS — K5909 Other constipation: Secondary | ICD-10-CM | POA: Diagnosis not present

## 2021-11-24 DIAGNOSIS — M25551 Pain in right hip: Secondary | ICD-10-CM | POA: Diagnosis not present

## 2021-11-24 DIAGNOSIS — I10 Essential (primary) hypertension: Secondary | ICD-10-CM | POA: Diagnosis not present

## 2021-11-24 DIAGNOSIS — Z0000 Encounter for general adult medical examination without abnormal findings: Secondary | ICD-10-CM | POA: Diagnosis not present

## 2021-11-24 DIAGNOSIS — R42 Dizziness and giddiness: Secondary | ICD-10-CM | POA: Diagnosis not present

## 2021-12-01 DIAGNOSIS — N1831 Chronic kidney disease, stage 3a: Secondary | ICD-10-CM | POA: Diagnosis not present

## 2021-12-01 DIAGNOSIS — I6521 Occlusion and stenosis of right carotid artery: Secondary | ICD-10-CM | POA: Diagnosis not present

## 2021-12-01 DIAGNOSIS — I10 Essential (primary) hypertension: Secondary | ICD-10-CM | POA: Diagnosis not present

## 2021-12-01 DIAGNOSIS — M25551 Pain in right hip: Secondary | ICD-10-CM | POA: Diagnosis not present

## 2021-12-01 DIAGNOSIS — I493 Ventricular premature depolarization: Secondary | ICD-10-CM | POA: Diagnosis not present

## 2021-12-01 DIAGNOSIS — J309 Allergic rhinitis, unspecified: Secondary | ICD-10-CM | POA: Diagnosis not present

## 2021-12-01 DIAGNOSIS — N400 Enlarged prostate without lower urinary tract symptoms: Secondary | ICD-10-CM | POA: Diagnosis not present

## 2021-12-01 DIAGNOSIS — R42 Dizziness and giddiness: Secondary | ICD-10-CM | POA: Diagnosis not present

## 2021-12-01 DIAGNOSIS — K5909 Other constipation: Secondary | ICD-10-CM | POA: Diagnosis not present

## 2021-12-23 DIAGNOSIS — R5382 Chronic fatigue, unspecified: Secondary | ICD-10-CM | POA: Diagnosis not present

## 2021-12-23 DIAGNOSIS — I6521 Occlusion and stenosis of right carotid artery: Secondary | ICD-10-CM | POA: Diagnosis not present

## 2021-12-23 DIAGNOSIS — I493 Ventricular premature depolarization: Secondary | ICD-10-CM | POA: Diagnosis not present

## 2021-12-23 DIAGNOSIS — E538 Deficiency of other specified B group vitamins: Secondary | ICD-10-CM | POA: Diagnosis not present

## 2021-12-23 DIAGNOSIS — R42 Dizziness and giddiness: Secondary | ICD-10-CM | POA: Diagnosis not present

## 2021-12-23 DIAGNOSIS — N400 Enlarged prostate without lower urinary tract symptoms: Secondary | ICD-10-CM | POA: Diagnosis not present

## 2021-12-23 DIAGNOSIS — K5909 Other constipation: Secondary | ICD-10-CM | POA: Diagnosis not present

## 2021-12-23 DIAGNOSIS — N1831 Chronic kidney disease, stage 3a: Secondary | ICD-10-CM | POA: Diagnosis not present

## 2021-12-23 DIAGNOSIS — I10 Essential (primary) hypertension: Secondary | ICD-10-CM | POA: Diagnosis not present

## 2021-12-23 DIAGNOSIS — J309 Allergic rhinitis, unspecified: Secondary | ICD-10-CM | POA: Diagnosis not present

## 2021-12-29 DIAGNOSIS — E291 Testicular hypofunction: Secondary | ICD-10-CM | POA: Diagnosis not present

## 2021-12-29 DIAGNOSIS — K5909 Other constipation: Secondary | ICD-10-CM | POA: Diagnosis not present

## 2021-12-29 DIAGNOSIS — R42 Dizziness and giddiness: Secondary | ICD-10-CM | POA: Diagnosis not present

## 2021-12-29 DIAGNOSIS — E538 Deficiency of other specified B group vitamins: Secondary | ICD-10-CM | POA: Diagnosis not present

## 2021-12-29 DIAGNOSIS — I6521 Occlusion and stenosis of right carotid artery: Secondary | ICD-10-CM | POA: Diagnosis not present

## 2021-12-29 DIAGNOSIS — N1831 Chronic kidney disease, stage 3a: Secondary | ICD-10-CM | POA: Diagnosis not present

## 2021-12-29 DIAGNOSIS — M159 Polyosteoarthritis, unspecified: Secondary | ICD-10-CM | POA: Diagnosis not present

## 2021-12-29 DIAGNOSIS — I10 Essential (primary) hypertension: Secondary | ICD-10-CM | POA: Diagnosis not present

## 2021-12-29 DIAGNOSIS — E039 Hypothyroidism, unspecified: Secondary | ICD-10-CM | POA: Diagnosis not present

## 2022-01-07 DIAGNOSIS — M25551 Pain in right hip: Secondary | ICD-10-CM | POA: Diagnosis not present

## 2022-01-07 DIAGNOSIS — J309 Allergic rhinitis, unspecified: Secondary | ICD-10-CM | POA: Diagnosis not present

## 2022-01-07 DIAGNOSIS — E291 Testicular hypofunction: Secondary | ICD-10-CM | POA: Diagnosis not present

## 2022-01-07 DIAGNOSIS — I10 Essential (primary) hypertension: Secondary | ICD-10-CM | POA: Diagnosis not present

## 2022-01-07 DIAGNOSIS — I6521 Occlusion and stenosis of right carotid artery: Secondary | ICD-10-CM | POA: Diagnosis not present

## 2022-01-07 DIAGNOSIS — I493 Ventricular premature depolarization: Secondary | ICD-10-CM | POA: Diagnosis not present

## 2022-01-07 DIAGNOSIS — N1831 Chronic kidney disease, stage 3a: Secondary | ICD-10-CM | POA: Diagnosis not present

## 2022-01-07 DIAGNOSIS — R42 Dizziness and giddiness: Secondary | ICD-10-CM | POA: Diagnosis not present

## 2022-01-07 DIAGNOSIS — K5909 Other constipation: Secondary | ICD-10-CM | POA: Diagnosis not present

## 2022-01-21 DIAGNOSIS — E291 Testicular hypofunction: Secondary | ICD-10-CM | POA: Diagnosis not present

## 2022-01-21 DIAGNOSIS — R42 Dizziness and giddiness: Secondary | ICD-10-CM | POA: Diagnosis not present

## 2022-01-21 DIAGNOSIS — N400 Enlarged prostate without lower urinary tract symptoms: Secondary | ICD-10-CM | POA: Diagnosis not present

## 2022-01-21 DIAGNOSIS — I10 Essential (primary) hypertension: Secondary | ICD-10-CM | POA: Diagnosis not present

## 2022-01-21 DIAGNOSIS — K5909 Other constipation: Secondary | ICD-10-CM | POA: Diagnosis not present

## 2022-01-21 DIAGNOSIS — I6521 Occlusion and stenosis of right carotid artery: Secondary | ICD-10-CM | POA: Diagnosis not present

## 2022-01-21 DIAGNOSIS — I493 Ventricular premature depolarization: Secondary | ICD-10-CM | POA: Diagnosis not present

## 2022-01-21 DIAGNOSIS — N1831 Chronic kidney disease, stage 3a: Secondary | ICD-10-CM | POA: Diagnosis not present

## 2022-01-21 DIAGNOSIS — J309 Allergic rhinitis, unspecified: Secondary | ICD-10-CM | POA: Diagnosis not present

## 2022-02-04 DIAGNOSIS — I6521 Occlusion and stenosis of right carotid artery: Secondary | ICD-10-CM | POA: Diagnosis not present

## 2022-02-04 DIAGNOSIS — I493 Ventricular premature depolarization: Secondary | ICD-10-CM | POA: Diagnosis not present

## 2022-02-04 DIAGNOSIS — E291 Testicular hypofunction: Secondary | ICD-10-CM | POA: Diagnosis not present

## 2022-02-04 DIAGNOSIS — R42 Dizziness and giddiness: Secondary | ICD-10-CM | POA: Diagnosis not present

## 2022-02-04 DIAGNOSIS — N400 Enlarged prostate without lower urinary tract symptoms: Secondary | ICD-10-CM | POA: Diagnosis not present

## 2022-02-04 DIAGNOSIS — N1831 Chronic kidney disease, stage 3a: Secondary | ICD-10-CM | POA: Diagnosis not present

## 2022-02-04 DIAGNOSIS — J309 Allergic rhinitis, unspecified: Secondary | ICD-10-CM | POA: Diagnosis not present

## 2022-02-04 DIAGNOSIS — I10 Essential (primary) hypertension: Secondary | ICD-10-CM | POA: Diagnosis not present

## 2022-02-04 DIAGNOSIS — K5909 Other constipation: Secondary | ICD-10-CM | POA: Diagnosis not present

## 2022-02-19 DIAGNOSIS — E291 Testicular hypofunction: Secondary | ICD-10-CM | POA: Diagnosis not present

## 2022-02-19 DIAGNOSIS — N1831 Chronic kidney disease, stage 3a: Secondary | ICD-10-CM | POA: Diagnosis not present

## 2022-02-19 DIAGNOSIS — E039 Hypothyroidism, unspecified: Secondary | ICD-10-CM | POA: Diagnosis not present

## 2022-02-19 DIAGNOSIS — R5382 Chronic fatigue, unspecified: Secondary | ICD-10-CM | POA: Diagnosis not present

## 2022-02-19 DIAGNOSIS — E538 Deficiency of other specified B group vitamins: Secondary | ICD-10-CM | POA: Diagnosis not present

## 2022-02-19 DIAGNOSIS — M159 Polyosteoarthritis, unspecified: Secondary | ICD-10-CM | POA: Diagnosis not present

## 2022-02-19 DIAGNOSIS — M25511 Pain in right shoulder: Secondary | ICD-10-CM | POA: Diagnosis not present

## 2022-02-19 DIAGNOSIS — I10 Essential (primary) hypertension: Secondary | ICD-10-CM | POA: Diagnosis not present

## 2022-02-19 DIAGNOSIS — E785 Hyperlipidemia, unspecified: Secondary | ICD-10-CM | POA: Diagnosis not present

## 2022-03-05 DIAGNOSIS — H1030 Unspecified acute conjunctivitis, unspecified eye: Secondary | ICD-10-CM | POA: Diagnosis not present

## 2022-03-05 DIAGNOSIS — I10 Essential (primary) hypertension: Secondary | ICD-10-CM | POA: Diagnosis not present

## 2022-03-05 DIAGNOSIS — E785 Hyperlipidemia, unspecified: Secondary | ICD-10-CM | POA: Diagnosis not present

## 2022-03-05 DIAGNOSIS — E291 Testicular hypofunction: Secondary | ICD-10-CM | POA: Diagnosis not present

## 2022-03-05 DIAGNOSIS — M159 Polyosteoarthritis, unspecified: Secondary | ICD-10-CM | POA: Diagnosis not present

## 2022-03-05 DIAGNOSIS — E039 Hypothyroidism, unspecified: Secondary | ICD-10-CM | POA: Diagnosis not present

## 2022-03-05 DIAGNOSIS — N1831 Chronic kidney disease, stage 3a: Secondary | ICD-10-CM | POA: Diagnosis not present

## 2022-03-05 DIAGNOSIS — R7989 Other specified abnormal findings of blood chemistry: Secondary | ICD-10-CM | POA: Diagnosis not present

## 2022-03-05 DIAGNOSIS — E538 Deficiency of other specified B group vitamins: Secondary | ICD-10-CM | POA: Diagnosis not present

## 2022-03-25 DIAGNOSIS — N1831 Chronic kidney disease, stage 3a: Secondary | ICD-10-CM | POA: Diagnosis not present

## 2022-03-25 DIAGNOSIS — E538 Deficiency of other specified B group vitamins: Secondary | ICD-10-CM | POA: Diagnosis not present

## 2022-03-25 DIAGNOSIS — M159 Polyosteoarthritis, unspecified: Secondary | ICD-10-CM | POA: Diagnosis not present

## 2022-03-25 DIAGNOSIS — I10 Essential (primary) hypertension: Secondary | ICD-10-CM | POA: Diagnosis not present

## 2022-03-25 DIAGNOSIS — E785 Hyperlipidemia, unspecified: Secondary | ICD-10-CM | POA: Diagnosis not present

## 2022-03-25 DIAGNOSIS — E039 Hypothyroidism, unspecified: Secondary | ICD-10-CM | POA: Diagnosis not present

## 2022-03-25 DIAGNOSIS — I6521 Occlusion and stenosis of right carotid artery: Secondary | ICD-10-CM | POA: Diagnosis not present

## 2022-03-25 DIAGNOSIS — J309 Allergic rhinitis, unspecified: Secondary | ICD-10-CM | POA: Diagnosis not present

## 2022-03-25 DIAGNOSIS — E291 Testicular hypofunction: Secondary | ICD-10-CM | POA: Diagnosis not present

## 2022-04-08 DIAGNOSIS — J309 Allergic rhinitis, unspecified: Secondary | ICD-10-CM | POA: Diagnosis not present

## 2022-04-08 DIAGNOSIS — N1831 Chronic kidney disease, stage 3a: Secondary | ICD-10-CM | POA: Diagnosis not present

## 2022-04-08 DIAGNOSIS — E039 Hypothyroidism, unspecified: Secondary | ICD-10-CM | POA: Diagnosis not present

## 2022-04-08 DIAGNOSIS — I10 Essential (primary) hypertension: Secondary | ICD-10-CM | POA: Diagnosis not present

## 2022-04-08 DIAGNOSIS — I6521 Occlusion and stenosis of right carotid artery: Secondary | ICD-10-CM | POA: Diagnosis not present

## 2022-04-08 DIAGNOSIS — E785 Hyperlipidemia, unspecified: Secondary | ICD-10-CM | POA: Diagnosis not present

## 2022-04-08 DIAGNOSIS — M159 Polyosteoarthritis, unspecified: Secondary | ICD-10-CM | POA: Diagnosis not present

## 2022-04-08 DIAGNOSIS — E291 Testicular hypofunction: Secondary | ICD-10-CM | POA: Diagnosis not present

## 2022-04-08 DIAGNOSIS — E538 Deficiency of other specified B group vitamins: Secondary | ICD-10-CM | POA: Diagnosis not present

## 2022-04-20 DIAGNOSIS — E785 Hyperlipidemia, unspecified: Secondary | ICD-10-CM | POA: Diagnosis not present

## 2022-04-20 DIAGNOSIS — J309 Allergic rhinitis, unspecified: Secondary | ICD-10-CM | POA: Diagnosis not present

## 2022-04-20 DIAGNOSIS — I6521 Occlusion and stenosis of right carotid artery: Secondary | ICD-10-CM | POA: Diagnosis not present

## 2022-04-20 DIAGNOSIS — N1831 Chronic kidney disease, stage 3a: Secondary | ICD-10-CM | POA: Diagnosis not present

## 2022-04-20 DIAGNOSIS — I10 Essential (primary) hypertension: Secondary | ICD-10-CM | POA: Diagnosis not present

## 2022-04-20 DIAGNOSIS — E291 Testicular hypofunction: Secondary | ICD-10-CM | POA: Diagnosis not present

## 2022-04-20 DIAGNOSIS — E538 Deficiency of other specified B group vitamins: Secondary | ICD-10-CM | POA: Diagnosis not present

## 2022-04-20 DIAGNOSIS — E039 Hypothyroidism, unspecified: Secondary | ICD-10-CM | POA: Diagnosis not present

## 2022-04-20 DIAGNOSIS — M159 Polyosteoarthritis, unspecified: Secondary | ICD-10-CM | POA: Diagnosis not present

## 2022-05-04 DIAGNOSIS — N1831 Chronic kidney disease, stage 3a: Secondary | ICD-10-CM | POA: Diagnosis not present

## 2022-05-04 DIAGNOSIS — E538 Deficiency of other specified B group vitamins: Secondary | ICD-10-CM | POA: Diagnosis not present

## 2022-05-04 DIAGNOSIS — I6521 Occlusion and stenosis of right carotid artery: Secondary | ICD-10-CM | POA: Diagnosis not present

## 2022-05-04 DIAGNOSIS — M159 Polyosteoarthritis, unspecified: Secondary | ICD-10-CM | POA: Diagnosis not present

## 2022-05-04 DIAGNOSIS — E785 Hyperlipidemia, unspecified: Secondary | ICD-10-CM | POA: Diagnosis not present

## 2022-05-04 DIAGNOSIS — E039 Hypothyroidism, unspecified: Secondary | ICD-10-CM | POA: Diagnosis not present

## 2022-05-04 DIAGNOSIS — I10 Essential (primary) hypertension: Secondary | ICD-10-CM | POA: Diagnosis not present

## 2022-05-04 DIAGNOSIS — E291 Testicular hypofunction: Secondary | ICD-10-CM | POA: Diagnosis not present

## 2022-05-04 DIAGNOSIS — J309 Allergic rhinitis, unspecified: Secondary | ICD-10-CM | POA: Diagnosis not present

## 2022-05-18 ENCOUNTER — Telehealth: Payer: Self-pay | Admitting: Cardiology

## 2022-05-18 DIAGNOSIS — E291 Testicular hypofunction: Secondary | ICD-10-CM | POA: Diagnosis not present

## 2022-05-18 DIAGNOSIS — E785 Hyperlipidemia, unspecified: Secondary | ICD-10-CM | POA: Diagnosis not present

## 2022-05-18 DIAGNOSIS — I10 Essential (primary) hypertension: Secondary | ICD-10-CM | POA: Diagnosis not present

## 2022-05-18 DIAGNOSIS — M159 Polyosteoarthritis, unspecified: Secondary | ICD-10-CM | POA: Diagnosis not present

## 2022-05-18 DIAGNOSIS — I6521 Occlusion and stenosis of right carotid artery: Secondary | ICD-10-CM | POA: Diagnosis not present

## 2022-05-18 DIAGNOSIS — E039 Hypothyroidism, unspecified: Secondary | ICD-10-CM | POA: Diagnosis not present

## 2022-05-18 DIAGNOSIS — N1831 Chronic kidney disease, stage 3a: Secondary | ICD-10-CM | POA: Diagnosis not present

## 2022-05-18 DIAGNOSIS — E538 Deficiency of other specified B group vitamins: Secondary | ICD-10-CM | POA: Diagnosis not present

## 2022-05-18 DIAGNOSIS — J309 Allergic rhinitis, unspecified: Secondary | ICD-10-CM | POA: Diagnosis not present

## 2022-05-18 MED ORDER — ROSUVASTATIN CALCIUM 10 MG PO TABS: 10.0000 mg | ORAL_TABLET | Freq: Every day | ORAL | 0 refills | Status: DC

## 2022-05-18 NOTE — Telephone Encounter (Signed)
Patient states he took his last pill of Crestor today, needs refill asap-   Preferred pharmacy is [facility] number [phone]

## 2022-05-24 ENCOUNTER — Other Ambulatory Visit: Payer: Self-pay | Admitting: Cardiology

## 2022-05-24 NOTE — Telephone Encounter (Signed)
Refill sent previous date

## 2022-05-26 ENCOUNTER — Other Ambulatory Visit: Payer: Self-pay

## 2022-05-31 ENCOUNTER — Encounter: Payer: Self-pay | Admitting: Cardiology

## 2022-05-31 ENCOUNTER — Ambulatory Visit: Payer: Medicare HMO | Attending: Cardiology | Admitting: Cardiology

## 2022-05-31 VITALS — BP 160/80 | HR 77 | Ht 74.0 in | Wt 197.2 lb

## 2022-05-31 DIAGNOSIS — E782 Mixed hyperlipidemia: Secondary | ICD-10-CM | POA: Diagnosis not present

## 2022-05-31 DIAGNOSIS — Z9889 Other specified postprocedural states: Secondary | ICD-10-CM | POA: Diagnosis not present

## 2022-05-31 DIAGNOSIS — I493 Ventricular premature depolarization: Secondary | ICD-10-CM | POA: Diagnosis not present

## 2022-05-31 MED ORDER — AMLODIPINE BESYLATE 5 MG PO TABS: 5.0000 mg | ORAL_TABLET | Freq: Every day | ORAL | 3 refills | Status: DC

## 2022-05-31 MED ORDER — ROSUVASTATIN CALCIUM 10 MG PO TABS: 10.0000 mg | ORAL_TABLET | Freq: Every day | ORAL | 3 refills | Status: DC

## 2022-05-31 NOTE — Progress Notes (Signed)
Cardiology Office Note:    Date:  05/31/2022   ID:  DONIVAN THAMMAVONG, DOB 1945/08/13, MRN [id number]  PCP:  Simone Curia, MD  Cardiologist:  Garwin Brothers, MD   Referring MD: Simone Curia, MD    ASSESSMENT:    1. PVC (premature ventricular contraction)   2. Status post carotid endarterectomy   3. Mixed dyslipidemia    PLAN:    In order of problems listed above:  Atherosclerotic vascular disease: Secondary prevention stressed with the patient.  Importance of compliance with diet medication stressed and vocalized understanding.  He was advised to walk at least half an hour a day 5 days a week and he promises to do so. Essential hypertension: Blood pressure stable and diet was emphasized.  Lifestyle modification urged.  His blood pressures are fine at home.  He has an element of whitecoat hypertension. Mixed dyslipidemia: On lipid-lowering medications.  Followed by primary care.  He will get blood work with primary care.  Recent blood work done at primary care was reviewed and discussed with the patient. History of PVCs: Stable and asymptomatic. Patient will be seen in follow-up appointment in 9 months or earlier if the patient has any concerns    Medication Adjustments/Labs and Tests Ordered: Current medicines are reviewed at length with the patient today.  Concerns regarding medicines are outlined above.  No orders of the defined types were placed in this encounter.  No orders of the defined types were placed in this encounter.    No chief complaint on file.    History of Present Illness:    CANDLER GINSBERG is a 76 y.o. male.  Patient has past medical history of atherosclerotic vascular disease post carotid endarterectomy, PVCs essential hypertension and dyslipidemia.  He denies any problems at this time and takes care of activities of daily living.  No chest pain orthopnea or PND.  At the time of my evaluation, the patient is alert awake oriented and in no distress.  Past  Medical History:  Diagnosis Date   Atherosclerotic vascular disease 01/03/2019   BMI 28.0-28.9,adult    Carotid stenosis 08/09/2018   Carotid stenosis, right 09/28/2018   History of kidney stones    h/o   Mixed dyslipidemia 01/03/2019   PVC (premature ventricular contraction) 01/16/2018   Status post carotid endarterectomy 01/03/2019    Past Surgical History:  Procedure Laterality Date   ENDARTERECTOMY Right 09/28/2018   Procedure: ENDARTERECTOMY CAROTID;  Surgeon: Annice Needy, MD;  Location: [facility] ORS;  Service: Vascular;  Laterality: Right;   I & D EXTREMITY Right 10/14/2018   Procedure: exploration right neck,drainage abscess,drain placement, carotid patch removal,saphenous vein harvest;  Surgeon: Ezequiel Essex, MD;  Location: [facility] ORS;  Service: Vascular;  Laterality: Right;   KIDNEY STONE SURGERY      Current Medications: Current Meds  Medication Sig   amLODipine (NORVASC) 5 MG tablet Take 5 mg by mouth daily.   ASPIRIN LOW DOSE 81 MG EC tablet TAKE ONE TABLET BY MOUTH EVERY DAY   clopidogrel (PLAVIX) 75 MG tablet TAKE ONE TABLET BY MOUTH EVERY DAY WITH BREAKFAST   finasteride (PROSCAR) 5 MG tablet Take 5 mg by mouth daily.   levothyroxine (SYNTHROID) 25 MCG tablet Take 25 mcg by mouth daily.   PATADAY 0.7 % SOLN Apply 1 drop to eye daily.   rosuvastatin (CRESTOR) 10 MG tablet Take 1 tablet (10 mg total) by mouth daily.   tamsulosin (FLOMAX) 0.4 MG CAPS capsule Take 1 capsule  by mouth daily.   testosterone enanthate (DELATESTRYL) 200 MG/ML injection Inject 200 mg into the muscle every 14 (fourteen) days.     Allergies:   Patient has no known allergies.   Social History   Socioeconomic History   Marital status: Married    Spouse name: Not on file   Number of children: Not on file   Years of education: Not on file   Highest education level: Not on file  Occupational History   Not on file  Tobacco Use   Smoking status: Never   Smokeless tobacco: Never  Vaping Use   Vaping  Use: Never used  Substance and Sexual Activity   Alcohol use: Never   Drug use: Never   Sexual activity: Not on file  Other Topics Concern   Not on file  Social History Narrative   Not on file   Social Determinants of Health   Financial Resource Strain: Not on file  Food Insecurity: Not on file  Transportation Needs: Not on file  Physical Activity: Not on file  Stress: Not on file  Social Connections: Not on file     Family History: The patient's family history includes Heart disease in his father; Hypertension in his mother; Prostate cancer in his father.  ROS:   Please see the history of present illness.    All other systems reviewed and are negative.  EKGs/Labs/Other Studies Reviewed:    The following studies were reviewed today: EKG reveals, nonspecific ST-T changes   Recent Labs: 07/28/2021: ALT 16; BUN 20; Creatinine, Ser 1.32; Hemoglobin 14.6; Platelets 168; Potassium 4.3; Sodium 143; TSH 5.270  Recent Lipid Panel    Component Value Date/Time   CHOL 126 07/28/2021 0924   TRIG 41 07/28/2021 0924   HDL 58 07/28/2021 0924   CHOLHDL 2.2 07/28/2021 0924   LDLCALC 58 07/28/2021 0924    Physical Exam:    VS:  BP (!) 160/96   Pulse 77   Ht 6\' 2"  (1.88 m)   Wt 197 lb 3.2 oz (89.4 kg)   SpO2 95%   BMI 25.32 kg/m     Wt Readings from Last 3 Encounters:  05/31/22 197 lb 3.2 oz (89.4 kg)  07/09/21 195 lb (88.5 kg)  06/23/21 186 lb (84.4 kg)     GEN: Patient is in no acute distress HEENT: Normal NECK: No JVD; No carotid bruits LYMPHATICS: No lymphadenopathy CARDIAC: Hear sounds regular, 2/6 systolic murmur at the apex. RESPIRATORY:  Clear to auscultation without rales, wheezing or rhonchi  ABDOMEN: Soft, non-tender, non-distended MUSCULOSKELETAL:  No edema; No deformity  SKIN: Warm and dry NEUROLOGIC:  Alert and oriented x 3 PSYCHIATRIC:  Normal affect   Signed, Jenean Lindau, MD  05/31/2022 9:27 AM    [place]

## 2022-05-31 NOTE — Addendum Note (Signed)
Addended by: Roosvelt Harps R on: 05/31/2022 02:42 PM   Modules accepted: Orders

## 2022-05-31 NOTE — Patient Instructions (Signed)

## 2022-06-01 DIAGNOSIS — M159 Polyosteoarthritis, unspecified: Secondary | ICD-10-CM | POA: Diagnosis not present

## 2022-06-01 DIAGNOSIS — F419 Anxiety disorder, unspecified: Secondary | ICD-10-CM | POA: Diagnosis not present

## 2022-06-01 DIAGNOSIS — I6521 Occlusion and stenosis of right carotid artery: Secondary | ICD-10-CM | POA: Diagnosis not present

## 2022-06-01 DIAGNOSIS — N1831 Chronic kidney disease, stage 3a: Secondary | ICD-10-CM | POA: Diagnosis not present

## 2022-06-01 DIAGNOSIS — E291 Testicular hypofunction: Secondary | ICD-10-CM | POA: Diagnosis not present

## 2022-06-01 DIAGNOSIS — I10 Essential (primary) hypertension: Secondary | ICD-10-CM | POA: Diagnosis not present

## 2022-06-01 DIAGNOSIS — E039 Hypothyroidism, unspecified: Secondary | ICD-10-CM | POA: Diagnosis not present

## 2022-06-01 DIAGNOSIS — E538 Deficiency of other specified B group vitamins: Secondary | ICD-10-CM | POA: Diagnosis not present

## 2022-06-01 DIAGNOSIS — E785 Hyperlipidemia, unspecified: Secondary | ICD-10-CM | POA: Diagnosis not present

## 2022-06-01 DIAGNOSIS — J309 Allergic rhinitis, unspecified: Secondary | ICD-10-CM | POA: Diagnosis not present

## 2022-06-07 ENCOUNTER — Other Ambulatory Visit (INDEPENDENT_AMBULATORY_CARE_PROVIDER_SITE_OTHER): Payer: Self-pay | Admitting: Vascular Surgery

## 2022-06-17 ENCOUNTER — Other Ambulatory Visit (INDEPENDENT_AMBULATORY_CARE_PROVIDER_SITE_OTHER): Payer: Self-pay | Admitting: Vascular Surgery

## 2022-06-17 DIAGNOSIS — I10 Essential (primary) hypertension: Secondary | ICD-10-CM | POA: Diagnosis not present

## 2022-06-17 DIAGNOSIS — M159 Polyosteoarthritis, unspecified: Secondary | ICD-10-CM | POA: Diagnosis not present

## 2022-06-17 DIAGNOSIS — I6523 Occlusion and stenosis of bilateral carotid arteries: Secondary | ICD-10-CM

## 2022-06-17 DIAGNOSIS — J309 Allergic rhinitis, unspecified: Secondary | ICD-10-CM | POA: Diagnosis not present

## 2022-06-17 DIAGNOSIS — N1831 Chronic kidney disease, stage 3a: Secondary | ICD-10-CM | POA: Diagnosis not present

## 2022-06-17 DIAGNOSIS — E291 Testicular hypofunction: Secondary | ICD-10-CM | POA: Diagnosis not present

## 2022-06-17 DIAGNOSIS — E538 Deficiency of other specified B group vitamins: Secondary | ICD-10-CM | POA: Diagnosis not present

## 2022-06-17 DIAGNOSIS — E039 Hypothyroidism, unspecified: Secondary | ICD-10-CM | POA: Diagnosis not present

## 2022-06-17 DIAGNOSIS — R600 Localized edema: Secondary | ICD-10-CM | POA: Diagnosis not present

## 2022-06-17 DIAGNOSIS — E785 Hyperlipidemia, unspecified: Secondary | ICD-10-CM | POA: Diagnosis not present

## 2022-06-18 ENCOUNTER — Ambulatory Visit (INDEPENDENT_AMBULATORY_CARE_PROVIDER_SITE_OTHER): Payer: Medicare HMO

## 2022-06-18 ENCOUNTER — Encounter (INDEPENDENT_AMBULATORY_CARE_PROVIDER_SITE_OTHER): Payer: Self-pay | Admitting: Vascular Surgery

## 2022-06-18 ENCOUNTER — Ambulatory Visit (INDEPENDENT_AMBULATORY_CARE_PROVIDER_SITE_OTHER): Payer: Medicare HMO | Admitting: Vascular Surgery

## 2022-06-18 VITALS — BP 174/90 | HR 60 | Resp 16 | Wt 194.0 lb

## 2022-06-18 DIAGNOSIS — E782 Mixed hyperlipidemia: Secondary | ICD-10-CM | POA: Diagnosis not present

## 2022-06-18 DIAGNOSIS — I6523 Occlusion and stenosis of bilateral carotid arteries: Secondary | ICD-10-CM | POA: Diagnosis not present

## 2022-06-18 NOTE — Assessment & Plan Note (Signed)
lipid control important in reducing the progression of atherosclerotic disease. Continue statin therapy  

## 2022-06-18 NOTE — Assessment & Plan Note (Signed)
Carotid duplex today reveals a widely patent right carotid endarterectomy with slight progression of his left carotid stenosis now just into the 40 to 59% range.  Doing well.  Continue current medical regimen.  Recheck in 1 year.

## 2022-06-18 NOTE — Progress Notes (Signed)
MRN : [id number]  Robert Roy is a 76 y.o. ([birth year]) male who presents with chief complaint of  Chief Complaint  Patient presents with   Follow-up    Ultrasound follow up  .  History of Present Illness: Patient returns in follow-up of his carotid disease.  He is doing well today.  He denies any focal neurologic symptoms. Specifically, the patient denies amaurosis fugax, speech or swallowing difficulties, or arm or leg weakness or numbness  He is almost 4 years status post carotid endarterectomy right side. Carotid duplex today reveals a widely patent right carotid endarterectomy with slight progression of his left carotid stenosis now just into the 40 to 59% range.   Current Outpatient Medications  Medication Sig Dispense Refill   amLODipine (NORVASC) 5 MG tablet Take 1 tablet (5 mg total) by mouth daily. 90 tablet 3   ASPIRIN LOW DOSE 81 MG tablet TAKE ONE TABLET BY MOUTH EVERY DAY 90 tablet 2   clopidogrel (PLAVIX) 75 MG tablet TAKE ONE TABLET BY MOUTH EVERY DAY WITH BREAKFAST 90 tablet 2   finasteride (PROSCAR) 5 MG tablet Take 5 mg by mouth daily.     furosemide (LASIX) 20 MG tablet Take 20 mg by mouth daily.     levothyroxine (SYNTHROID) 25 MCG tablet Take 25 mcg by mouth daily.     rosuvastatin (CRESTOR) 10 MG tablet Take 1 tablet (10 mg total) by mouth daily. 90 tablet 3   tamsulosin (FLOMAX) 0.4 MG CAPS capsule Take 1 capsule by mouth daily.     PATADAY 0.7 % SOLN Apply 1 drop to eye daily. (Patient not taking: Reported on 06/18/2022)     testosterone enanthate (DELATESTRYL) 200 MG/ML injection Inject 200 mg into the muscle every 14 (fourteen) days. (Patient not taking: Reported on 06/18/2022)     No current facility-administered medications for this visit.    Past Medical History:  Diagnosis Date   Atherosclerotic vascular disease 01/03/2019   BMI 28.0-28.9,adult    Carotid stenosis 08/09/2018   Carotid stenosis, right 09/28/2018   History of kidney stones    h/o    Mixed dyslipidemia 01/03/2019   PVC (premature ventricular contraction) 01/16/2018   Status post carotid endarterectomy 01/03/2019    Past Surgical History:  Procedure Laterality Date   ENDARTERECTOMY Right 09/28/2018   Procedure: ENDARTERECTOMY CAROTID;  Surgeon: Annice Needy, MD;  Location: [facility];  Service: Vascular;  Laterality: Right;   I & D EXTREMITY Right 10/14/2018   Procedure: exploration right neck,drainage abscess,drain placement, carotid patch removal,saphenous vein harvest;  Surgeon: Ezequiel Essex, MD;  Location: [facility];  Service: Vascular;  Laterality: Right;   KIDNEY STONE SURGERY       Social History   Tobacco Use   Smoking status: Never   Smokeless tobacco: Never  Vaping Use   Vaping Use: Never used  Substance Use Topics   Alcohol use: Never   Drug use: Never      Family History  Problem Relation Age of Onset   Hypertension Mother    Heart disease Father    Prostate cancer Father      No Known Allergies   REVIEW OF SYSTEMS (Negative unless checked)  Constitutional: [] Weight loss  [] Fever  [] Chills Cardiac: [] Chest pain   [] Chest pressure   [] Palpitations   [] Shortness of breath when laying flat   [] Shortness of breath at rest   [] Shortness of breath with exertion. Vascular:  [] Pain in legs with walking   [] Pain  in legs at rest   [] Pain in legs when laying flat   [] Claudication   [] Pain in feet when walking  [] Pain in feet at rest  [] Pain in feet when laying flat   [] History of DVT   [] Phlebitis   [] Swelling in legs   [] Varicose veins   [] Non-healing ulcers Pulmonary:   [] Uses home oxygen   [] Productive cough   [] Hemoptysis   [] Wheeze  [] COPD   [] Asthma Neurologic:  [] Dizziness  [] Blackouts   [] Seizures   [] History of stroke   [] History of TIA  [] Aphasia   [] Temporary blindness   [] Dysphagia   [] Weakness or numbness in arms   [] Weakness or numbness in legs Musculoskeletal:  [] Arthritis   [] Joint swelling   [] Joint pain   [] Low back pain Hematologic:   [] Easy bruising  [] Easy bleeding   [] Hypercoagulable state   [] Anemic  [] Hepatitis Gastrointestinal:  [] Blood in stool   [] Vomiting blood  [] Gastroesophageal reflux/heartburn   [] Difficulty swallowing. Genitourinary:  [] Chronic kidney disease   [] Difficult urination  [] Frequent urination  [] Burning with urination   [] Blood in urine Skin:  [] Rashes   [] Ulcers   [] Wounds Psychological:  [x] History of anxiety   []  History of major depression.  Physical Examination  Vitals:   06/18/22 1035  BP: (!) 174/90  Pulse: 60  Resp: 16  Weight: 194 lb (88 kg)   Body mass index is 24.91 kg/m. Gen:  WD/WN, NAD Head: [place]/AT, No temporalis wasting. Ear/Nose/Throat: Hearing grossly intact, nares w/o erythema or drainage, trachea midline Eyes: Conjunctiva clear. Sclera non-icteric Neck: Supple.  No bruit  Pulmonary:  Good air movement, equal and clear to auscultation bilaterally.  Cardiac: RRR, No JVD Vascular:  Vessel Right Left  Radial Palpable Palpable               Musculoskeletal: M/S 5/5 throughout.  No deformity or atrophy. No edema. Neurologic: CN 2-12 intact. Sensation grossly intact in extremities.  Symmetrical.  Speech is fluent. Motor exam as listed above. Psychiatric: Judgment intact, Mood & affect appropriate for pt's clinical situation. Dermatologic: No rashes or ulcers noted.  No cellulitis or open wounds.     CBC Lab Results  Component Value Date   WBC 5.6 07/28/2021   HGB 14.6 07/28/2021   HCT 42.9 07/28/2021   MCV 92 07/28/2021   PLT 168 07/28/2021    BMET    Component Value Date/Time   NA 143 07/28/2021 0924   K 4.3 07/28/2021 0924   CL 108 (H) 07/28/2021 0924   CO2 26 07/28/2021 0924   GLUCOSE 88 07/28/2021 0924   GLUCOSE 104 (H) 10/16/2018 0338   BUN 20 07/28/2021 0924   CREATININE 1.32 (H) 07/28/2021 0924   CALCIUM 10.5 (H) 07/28/2021 0924   GFRNONAA 57 (L) 06/03/2020 0837   GFRAA 66 06/03/2020 0837   CrCl cannot be calculated (Patient's most recent  lab result is older than the maximum 21 days allowed.).  COAG Lab Results  Component Value Date   INR 1.1 10/14/2018   INR 1.1 09/21/2018    Radiology No results found.   Assessment/Plan Carotid stenosis Carotid duplex today reveals a widely patent right carotid endarterectomy with slight progression of his left carotid stenosis now just into the 40 to 59% range.  Doing well.  Continue current medical regimen.  Recheck in 1 year.  Mixed dyslipidemia lipid control important in reducing the progression of atherosclerotic disease. Continue statin therapy    , MD  06/18/2022 11:05 AM  This note was created with Dragon medical transcription system.  Any errors from dictation are purely unintentional

## 2022-06-23 DIAGNOSIS — H2513 Age-related nuclear cataract, bilateral: Secondary | ICD-10-CM | POA: Diagnosis not present

## 2022-06-23 DIAGNOSIS — H11151 Pinguecula, right eye: Secondary | ICD-10-CM | POA: Diagnosis not present

## 2022-06-23 DIAGNOSIS — H40013 Open angle with borderline findings, low risk, bilateral: Secondary | ICD-10-CM | POA: Diagnosis not present

## 2022-06-23 DIAGNOSIS — H25013 Cortical age-related cataract, bilateral: Secondary | ICD-10-CM | POA: Diagnosis not present

## 2022-06-23 DIAGNOSIS — H02839 Dermatochalasis of unspecified eye, unspecified eyelid: Secondary | ICD-10-CM | POA: Diagnosis not present

## 2022-06-23 DIAGNOSIS — H524 Presbyopia: Secondary | ICD-10-CM | POA: Diagnosis not present

## 2022-06-30 DIAGNOSIS — I6521 Occlusion and stenosis of right carotid artery: Secondary | ICD-10-CM | POA: Diagnosis not present

## 2022-06-30 DIAGNOSIS — N1831 Chronic kidney disease, stage 3a: Secondary | ICD-10-CM | POA: Diagnosis not present

## 2022-06-30 DIAGNOSIS — E039 Hypothyroidism, unspecified: Secondary | ICD-10-CM | POA: Diagnosis not present

## 2022-06-30 DIAGNOSIS — M159 Polyosteoarthritis, unspecified: Secondary | ICD-10-CM | POA: Diagnosis not present

## 2022-06-30 DIAGNOSIS — J309 Allergic rhinitis, unspecified: Secondary | ICD-10-CM | POA: Diagnosis not present

## 2022-06-30 DIAGNOSIS — E785 Hyperlipidemia, unspecified: Secondary | ICD-10-CM | POA: Diagnosis not present

## 2022-06-30 DIAGNOSIS — E538 Deficiency of other specified B group vitamins: Secondary | ICD-10-CM | POA: Diagnosis not present

## 2022-06-30 DIAGNOSIS — R600 Localized edema: Secondary | ICD-10-CM | POA: Diagnosis not present

## 2022-06-30 DIAGNOSIS — I10 Essential (primary) hypertension: Secondary | ICD-10-CM | POA: Diagnosis not present

## 2022-07-13 DIAGNOSIS — E039 Hypothyroidism, unspecified: Secondary | ICD-10-CM | POA: Diagnosis not present

## 2022-07-13 DIAGNOSIS — Z0000 Encounter for general adult medical examination without abnormal findings: Secondary | ICD-10-CM | POA: Diagnosis not present

## 2022-07-13 DIAGNOSIS — E291 Testicular hypofunction: Secondary | ICD-10-CM | POA: Diagnosis not present

## 2022-07-13 DIAGNOSIS — I10 Essential (primary) hypertension: Secondary | ICD-10-CM | POA: Diagnosis not present

## 2022-07-13 DIAGNOSIS — R600 Localized edema: Secondary | ICD-10-CM | POA: Diagnosis not present

## 2022-07-13 DIAGNOSIS — E785 Hyperlipidemia, unspecified: Secondary | ICD-10-CM | POA: Diagnosis not present

## 2022-07-13 DIAGNOSIS — N1831 Chronic kidney disease, stage 3a: Secondary | ICD-10-CM | POA: Diagnosis not present

## 2022-07-13 DIAGNOSIS — M159 Polyosteoarthritis, unspecified: Secondary | ICD-10-CM | POA: Diagnosis not present

## 2022-07-13 DIAGNOSIS — Z1211 Encounter for screening for malignant neoplasm of colon: Secondary | ICD-10-CM | POA: Diagnosis not present

## 2022-09-07 DIAGNOSIS — H25013 Cortical age-related cataract, bilateral: Secondary | ICD-10-CM | POA: Diagnosis not present

## 2022-09-07 DIAGNOSIS — H2513 Age-related nuclear cataract, bilateral: Secondary | ICD-10-CM | POA: Diagnosis not present

## 2022-09-07 DIAGNOSIS — H40013 Open angle with borderline findings, low risk, bilateral: Secondary | ICD-10-CM | POA: Diagnosis not present

## 2022-09-07 DIAGNOSIS — H18413 Arcus senilis, bilateral: Secondary | ICD-10-CM | POA: Diagnosis not present

## 2022-09-07 DIAGNOSIS — H2512 Age-related nuclear cataract, left eye: Secondary | ICD-10-CM | POA: Diagnosis not present

## 2022-09-14 DIAGNOSIS — E538 Deficiency of other specified B group vitamins: Secondary | ICD-10-CM | POA: Diagnosis not present

## 2022-09-14 DIAGNOSIS — R600 Localized edema: Secondary | ICD-10-CM | POA: Diagnosis not present

## 2022-09-14 DIAGNOSIS — E039 Hypothyroidism, unspecified: Secondary | ICD-10-CM | POA: Diagnosis not present

## 2022-09-14 DIAGNOSIS — N1831 Chronic kidney disease, stage 3a: Secondary | ICD-10-CM | POA: Diagnosis not present

## 2022-09-14 DIAGNOSIS — I10 Essential (primary) hypertension: Secondary | ICD-10-CM | POA: Diagnosis not present

## 2022-09-14 DIAGNOSIS — J309 Allergic rhinitis, unspecified: Secondary | ICD-10-CM | POA: Diagnosis not present

## 2022-09-14 DIAGNOSIS — I6521 Occlusion and stenosis of right carotid artery: Secondary | ICD-10-CM | POA: Diagnosis not present

## 2022-09-14 DIAGNOSIS — E785 Hyperlipidemia, unspecified: Secondary | ICD-10-CM | POA: Diagnosis not present

## 2022-09-14 DIAGNOSIS — M159 Polyosteoarthritis, unspecified: Secondary | ICD-10-CM | POA: Diagnosis not present

## 2022-11-02 ENCOUNTER — Other Ambulatory Visit: Payer: Self-pay

## 2022-11-02 DIAGNOSIS — E039 Hypothyroidism, unspecified: Secondary | ICD-10-CM | POA: Insufficient documentation

## 2022-11-02 DIAGNOSIS — E538 Deficiency of other specified B group vitamins: Secondary | ICD-10-CM | POA: Insufficient documentation

## 2022-11-02 DIAGNOSIS — N1831 Chronic kidney disease, stage 3a: Secondary | ICD-10-CM | POA: Insufficient documentation

## 2022-11-02 DIAGNOSIS — E291 Testicular hypofunction: Secondary | ICD-10-CM | POA: Insufficient documentation

## 2022-11-02 DIAGNOSIS — E785 Hyperlipidemia, unspecified: Secondary | ICD-10-CM | POA: Insufficient documentation

## 2022-11-02 DIAGNOSIS — R1310 Dysphagia, unspecified: Secondary | ICD-10-CM | POA: Insufficient documentation

## 2022-11-02 DIAGNOSIS — F419 Anxiety disorder, unspecified: Secondary | ICD-10-CM | POA: Insufficient documentation

## 2022-11-02 DIAGNOSIS — R55 Syncope and collapse: Secondary | ICD-10-CM | POA: Insufficient documentation

## 2022-11-02 DIAGNOSIS — Z6823 Body mass index (BMI) 23.0-23.9, adult: Secondary | ICD-10-CM | POA: Insufficient documentation

## 2022-11-02 DIAGNOSIS — R42 Dizziness and giddiness: Secondary | ICD-10-CM | POA: Insufficient documentation

## 2022-11-02 DIAGNOSIS — I6521 Occlusion and stenosis of right carotid artery: Secondary | ICD-10-CM | POA: Insufficient documentation

## 2022-11-02 DIAGNOSIS — R600 Localized edema: Secondary | ICD-10-CM | POA: Insufficient documentation

## 2022-11-02 DIAGNOSIS — I10 Essential (primary) hypertension: Secondary | ICD-10-CM | POA: Insufficient documentation

## 2022-11-02 DIAGNOSIS — I493 Ventricular premature depolarization: Secondary | ICD-10-CM | POA: Insufficient documentation

## 2022-11-02 DIAGNOSIS — R0600 Dyspnea, unspecified: Secondary | ICD-10-CM | POA: Insufficient documentation

## 2022-11-02 DIAGNOSIS — J309 Allergic rhinitis, unspecified: Secondary | ICD-10-CM | POA: Insufficient documentation

## 2022-11-02 DIAGNOSIS — K5909 Other constipation: Secondary | ICD-10-CM | POA: Insufficient documentation

## 2022-11-02 DIAGNOSIS — M159 Polyosteoarthritis, unspecified: Secondary | ICD-10-CM | POA: Insufficient documentation

## 2022-11-02 DIAGNOSIS — N400 Enlarged prostate without lower urinary tract symptoms: Secondary | ICD-10-CM | POA: Insufficient documentation

## 2022-11-15 ENCOUNTER — Ambulatory Visit: Payer: Medicare HMO | Attending: Cardiology | Admitting: Cardiology

## 2022-11-16 ENCOUNTER — Encounter: Payer: Self-pay | Admitting: Cardiology

## 2022-11-26 ENCOUNTER — Ambulatory Visit: Payer: Medicare HMO | Admitting: Cardiology

## 2022-12-30 ENCOUNTER — Other Ambulatory Visit (INDEPENDENT_AMBULATORY_CARE_PROVIDER_SITE_OTHER): Payer: Self-pay | Admitting: Vascular Surgery

## 2023-02-15 ENCOUNTER — Other Ambulatory Visit (INDEPENDENT_AMBULATORY_CARE_PROVIDER_SITE_OTHER): Payer: Self-pay | Admitting: Vascular Surgery

## 2023-02-24 ENCOUNTER — Other Ambulatory Visit: Payer: Self-pay

## 2023-02-25 ENCOUNTER — Ambulatory Visit: Payer: Medicare HMO | Attending: Cardiology | Admitting: Cardiology

## 2023-02-25 ENCOUNTER — Encounter: Payer: Self-pay | Admitting: Cardiology

## 2023-02-25 VITALS — BP 106/60 | HR 68 | Ht 74.0 in | Wt 186.0 lb

## 2023-02-25 DIAGNOSIS — E785 Hyperlipidemia, unspecified: Secondary | ICD-10-CM

## 2023-02-25 DIAGNOSIS — I7090 Unspecified atherosclerosis: Secondary | ICD-10-CM | POA: Diagnosis not present

## 2023-02-25 DIAGNOSIS — E782 Mixed hyperlipidemia: Secondary | ICD-10-CM

## 2023-02-25 DIAGNOSIS — I493 Ventricular premature depolarization: Secondary | ICD-10-CM

## 2023-02-25 DIAGNOSIS — I6522 Occlusion and stenosis of left carotid artery: Secondary | ICD-10-CM | POA: Diagnosis not present

## 2023-02-25 DIAGNOSIS — I10 Essential (primary) hypertension: Secondary | ICD-10-CM

## 2023-02-25 NOTE — Patient Instructions (Signed)

## 2023-02-25 NOTE — Progress Notes (Signed)
Cardiology Office Note:    Date:  02/25/2023   ID:  Robert Roy, DOB 03-15-1946, MRN [id number]  PCP:  Simone Curia, MD  Cardiologist:  Garwin Brothers, MD   Referring MD: Simone Curia, MD    ASSESSMENT:    1. Atherosclerotic vascular disease   2. Stenosis of left carotid artery   3. Hypertension, benign   4. PVC (premature ventricular contraction)   5. Dyslipidemia   6. Mixed dyslipidemia    PLAN:    In order of problems listed above:  Atherosclerotic vascular disease: Post right carotid endarterectomy: Secondary prevention stressed with the patient.  Importance of compliance with diet medication stressed any vocalized understanding.  He was advised to walk at least half an hour a day 5 days a week and he promises to do so. Essential hypertension: Blood pressure stable and diet was emphasized.  His blood pressure is in the range of 130/70 at home. Mixed dyslipidemia: On lipid-lowering medications followed by primary care.  He had blood work this morning and will send me a copy when he gets the results. History of PVCs: Stable and asymptomatic at this time. Patient will be seen in follow-up appointment in 6 months or earlier if the patient has any concerns.    Medication Adjustments/Labs and Tests Ordered: Current medicines are reviewed at length with the patient today.  Concerns regarding medicines are outlined above.  No orders of the defined types were placed in this encounter.  No orders of the defined types were placed in this encounter.    No chief complaint on file.    History of Present Illness:    Robert Roy is a 77 y.o. male.  Patient has past medical history of atherosclerotic vascular disease, post carotid endarterectomy on the right side.  He is monitored closely by vascular surgery for left carotid artery evaluation and follow-up.  Patient history of hypertension, dyslipidemia and PVCs.  He denies any problems at this time and takes care of activities of  daily living.  He is an active gentleman but does not exercise on a regular basis.  At the time of my evaluation, the patient is alert awake oriented and in no distress.  Past Medical History:  Diagnosis Date   Allergic rhinitis    Anxiety    Atherosclerotic vascular disease 01/03/2019   B12 deficiency    BMI 23.0-23.9, adult    BMI 28.0-28.9,adult    BPH (benign prostatic hyperplasia)    Carotid artery stenosis, asymptomatic, right    Carotid stenosis 08/09/2018   Carotid stenosis, right 09/28/2018   Chronic constipation    Dizziness    Dyslipidemia    Dysphagia    Dyspnea    Frequent PVCs    Generalized osteoarthritis    History of kidney stones    h/o   Hypertension, benign    Hypogonadism male    Hypothyroidism    Lower extremity edema    Mixed dyslipidemia 01/03/2019   PVC (premature ventricular contraction) 01/16/2018   Stage 3a chronic kidney disease (CKD) (HCC)    Status post carotid endarterectomy 01/03/2019   Syncope     Past Surgical History:  Procedure Laterality Date   ENDARTERECTOMY Right 09/28/2018   Procedure: ENDARTERECTOMY CAROTID;  Surgeon: Annice Needy, MD;  Location: [facility];  Service: Vascular;  Laterality: Right;   I & D EXTREMITY Right 10/14/2018   Procedure: exploration right neck,drainage abscess,drain placement, carotid patch removal,saphenous vein harvest;  Surgeon: Ezequiel Essex, MD;  Location: [facility] OR[facility];  Service: Vascular;  Laterality: Right;   KIDNEY STONE SURGERY      Current Medications: Current Meds  Medication Sig   amLODipine (NORVASC) 2.5 MG tablet Take 2.5 mg by mouth daily.   ASPIRIN LOW DOSE 81 MG tablet TAKE ONE TABLET BY MOUTH EVERY DAY   clopidogrel (PLAVIX) 75 MG tablet TAKE ONE TABLET BY MOUTH EVERY DAY WITH BREAKFAST   finasteride (PROSCAR) 5 MG tablet Take 5 mg by mouth daily.   rosuvastatin (CRESTOR) 10 MG tablet Take 10 mg by mouth daily.   tamsulosin (FLOMAX) 0.4 MG CAPS capsule Take 1 capsule by mouth daily.      Allergies:   Testosterone enanthate   Social History   Socioeconomic History   Marital status: Married    Spouse name: Not on file   Number of children: Not on file   Years of education: Not on file   Highest education level: Not on file  Occupational History   Not on file  Tobacco Use   Smoking status: Never   Smokeless tobacco: Never  Vaping Use   Vaping status: Never Used  Substance and Sexual Activity   Alcohol use: Never   Drug use: Never   Sexual activity: Not on file  Other Topics Concern   Not on file  Social History Narrative   Not on file   Social Determinants of Health   Financial Resource Strain: Not on file  Food Insecurity: Not on file  Transportation Needs: Not on file  Physical Activity: Not on file  Stress: Not on file  Social Connections: Not on file     Family History: The patient's family history includes Heart disease in his father; Hypertension in his mother; Prostate cancer in his father.  ROS:   Please see the history of present illness.    All other systems reviewed and are negative.  EKGs/Labs/Other Studies Reviewed:    The following studies were reviewed today: I discussed my findings with the patient at length   Recent Labs: No results found for requested labs within last 365 days.  Recent Lipid Panel    Component Value Date/Time   CHOL 126 07/28/2021 0924   TRIG 41 07/28/2021 0924   HDL 58 07/28/2021 0924   CHOLHDL 2.2 07/28/2021 0924   LDLCALC 58 07/28/2021 0924    Physical Exam:    VS:  BP 106/60   Pulse 68   Ht 6\' 2"  (1.88 m)   Wt 186 lb (84.4 kg)   SpO2 94%   BMI 23.88 kg/m     Wt Readings from Last 3 Encounters:  02/25/23 186 lb (84.4 kg)  06/18/22 194 lb (88 kg)  05/31/22 197 lb 3.2 oz (89.4 kg)     GEN: Patient is in no acute distress HEENT: Normal NECK: No JVD; No carotid bruits LYMPHATICS: No lymphadenopathy CARDIAC: Hear sounds regular, 2/6 systolic murmur at the apex. RESPIRATORY:  Clear to  auscultation without rales, wheezing or rhonchi  ABDOMEN: Soft, non-tender, non-distended MUSCULOSKELETAL:  No edema; No deformity  SKIN: Warm and dry NEUROLOGIC:  Alert and oriented x 3 PSYCHIATRIC:  Normal affect   Signed, Garwin Brothers, MD  02/25/2023 2:37 PM    [facility]

## 2023-03-16 ENCOUNTER — Other Ambulatory Visit: Payer: Self-pay | Admitting: Cardiology

## 2023-06-17 ENCOUNTER — Ambulatory Visit (INDEPENDENT_AMBULATORY_CARE_PROVIDER_SITE_OTHER): Payer: Medicare HMO

## 2023-06-17 ENCOUNTER — Encounter (INDEPENDENT_AMBULATORY_CARE_PROVIDER_SITE_OTHER): Payer: Self-pay

## 2023-06-17 ENCOUNTER — Ambulatory Visit (INDEPENDENT_AMBULATORY_CARE_PROVIDER_SITE_OTHER): Payer: Medicare HMO | Admitting: Vascular Surgery

## 2023-06-17 DIAGNOSIS — I6523 Occlusion and stenosis of bilateral carotid arteries: Secondary | ICD-10-CM | POA: Diagnosis not present

## 2023-06-17 NOTE — Assessment & Plan Note (Signed)
blood pressure control important in reducing the progression of atherosclerotic disease. On appropriate oral medications.  

## 2023-06-17 NOTE — Progress Notes (Unsigned)
MRN : [id number]  Robert Roy is a 77 y.o. (05/30/1946) male who presents with chief complaint of No chief complaint on file. Robert Roy  History of Present Illness: ***  Current Outpatient Medications  Medication Sig Dispense Refill   amLODipine (NORVASC) 2.5 MG tablet Take 2.5 mg by mouth daily.     ASPIRIN LOW DOSE 81 MG tablet TAKE ONE TABLET BY MOUTH EVERY DAY 90 tablet 2   clopidogrel (PLAVIX) 75 MG tablet TAKE ONE TABLET BY MOUTH EVERY DAY WITH BREAKFAST 90 tablet 2   finasteride (PROSCAR) 5 MG tablet Take 5 mg by mouth daily.     rosuvastatin (CRESTOR) 10 MG tablet Take 1 tablet (10 mg total) by mouth daily. 90 tablet 2   tamsulosin (FLOMAX) 0.4 MG CAPS capsule Take 1 capsule by mouth daily.     No current facility-administered medications for this visit.    Past Medical History:  Diagnosis Date   Allergic rhinitis    Anxiety    Atherosclerotic vascular disease 01/03/2019   B12 deficiency    BMI 23.0-23.9, adult    BMI 28.0-28.9,adult    BPH (benign prostatic hyperplasia)    Carotid artery stenosis, asymptomatic, right    Carotid stenosis 08/09/2018   Carotid stenosis, right 09/28/2018   Chronic constipation    Dizziness    Dyslipidemia    Dysphagia    Dyspnea    Frequent PVCs    Generalized osteoarthritis    History of kidney stones    h/o   Hypertension, benign    Hypogonadism male    Hypothyroidism    Lower extremity edema    Mixed dyslipidemia 01/03/2019   PVC (premature ventricular contraction) 01/16/2018   Stage 3a chronic kidney disease (CKD) (HCC)    Status post carotid endarterectomy 01/03/2019   Syncope     Past Surgical History:  Procedure Laterality Date   ENDARTERECTOMY Right 09/28/2018   Procedure: ENDARTERECTOMY CAROTID;  Surgeon: Annice Needy, MD;  Location: [facility] ORS;  Service: Vascular;  Laterality: Right;   I & D EXTREMITY Right 10/14/2018   Procedure: exploration right neck,drainage abscess,drain placement, carotid patch removal,saphenous  vein harvest;  Surgeon: Ezequiel Essex, MD;  Location: [facility] ORS;  Service: Vascular;  Laterality: Right;   KIDNEY STONE SURGERY       Social History   Tobacco Use   Smoking status: Never   Smokeless tobacco: Never  Vaping Use   Vaping status: Never Used  Substance Use Topics   Alcohol use: Never   Drug use: Never   ***    Family History  Problem Relation Age of Onset   Hypertension Mother    Heart disease Father    Prostate cancer Father    ***  Allergies  Allergen Reactions   Testosterone Enanthate Rash     REVIEW OF SYSTEMS (Negative unless checked)  Constitutional: [] Weight loss  [] Fever  [] Chills Cardiac: [] Chest pain   [] Chest pressure   [] Palpitations   [] Shortness of breath when laying flat   [] Shortness of breath at rest   [] Shortness of breath with exertion. Vascular:  [] Pain in legs with walking   [] Pain in legs at rest   [] Pain in legs when laying flat   [] Claudication   [] Pain in feet when walking  [] Pain in feet at rest  [] Pain in feet when laying flat   [] History of DVT   [] Phlebitis   [] Swelling in legs   [] Varicose veins   [] Non-healing ulcers Pulmonary:   []   Uses home oxygen   [] Productive cough   [] Hemoptysis   [] Wheeze  [] COPD   [] Asthma Neurologic:  [] Dizziness  [] Blackouts   [] Seizures   [] History of stroke   [] History of TIA  [] Aphasia   [] Temporary blindness   [] Dysphagia   [] Weakness or numbness in arms   [] Weakness or numbness in legs Musculoskeletal:  [] Arthritis   [] Joint swelling   [] Joint pain   [] Low back pain Hematologic:  [] Easy bruising  [] Easy bleeding   [] Hypercoagulable state   [] Anemic  [] Hepatitis Gastrointestinal:  [] Blood in stool   [] Vomiting blood  [] Gastroesophageal reflux/heartburn   [] Difficulty swallowing. Genitourinary:  [] Chronic kidney disease   [] Difficult urination  [] Frequent urination  [] Burning with urination   [] Blood in urine Skin:  [] Rashes   [] Ulcers   [] Wounds Psychological:  [x] History of anxiety   []  History of  major depression.  Physical Examination  There were no vitals filed for this visit. There is no height or weight on file to calculate BMI. Gen:  WD/WN, NAD Head: [place]/AT, No temporalis wasting. Ear/Nose/Throat: Hearing grossly intact, nares w/o erythema or drainage, trachea midline Eyes: Conjunctiva clear. Sclera non-icteric Neck: Supple.  *** bruit  Pulmonary:  Good air movement, equal and clear to auscultation bilaterally.  Cardiac: RRR, No JVD Vascular:  Vessel Right Left  Radial Palpable Palpable           Musculoskeletal: M/S 5/5 throughout.  No deformity or atrophy. *** edema. Neurologic: CN 2-12 intact. Sensation grossly intact in extremities.  Symmetrical.  Speech is fluent. Motor exam as listed above. Psychiatric: Judgment intact, Mood & affect appropriate for pt's clinical situation. Dermatologic: No rashes or ulcers noted.  No cellulitis or open wounds.     CBC Lab Results  Component Value Date   WBC 5.6 07/28/2021   HGB 14.6 07/28/2021   HCT 42.9 07/28/2021   MCV 92 07/28/2021   PLT 168 07/28/2021    BMET    Component Value Date/Time   NA 143 07/28/2021 0924   K 4.3 07/28/2021 0924   CL 108 (H) 07/28/2021 0924   CO2 26 07/28/2021 0924   GLUCOSE 88 07/28/2021 0924   GLUCOSE 104 (H) 10/16/2018 0338   BUN 20 07/28/2021 0924   CREATININE 1.32 (H) 07/28/2021 0924   CALCIUM 10.5 (H) 07/28/2021 0924   GFRNONAA 57 (L) 06/03/2020 0837   GFRAA 66 06/03/2020 0837   CrCl cannot be calculated (Patient's most recent lab result is older than the maximum 21 days allowed.).  COAG Lab Results  Component Value Date   INR 1.1 10/14/2018   INR 1.1 09/21/2018    Radiology No results found.   Assessment/Plan No problem-specific Assessment & Plan notes found for this encounter.    Robert Barren, MD  06/17/2023 11:52 AM    This note was created with Dragon medical transcription system.  Any errors from dictation are purely unintentional

## 2023-06-17 NOTE — Assessment & Plan Note (Signed)
lipid control important in reducing the progression of atherosclerotic disease. Continue statin therapy  

## 2023-11-02 ENCOUNTER — Other Ambulatory Visit: Payer: Self-pay | Admitting: Cardiology

## 2023-11-24 ENCOUNTER — Other Ambulatory Visit (INDEPENDENT_AMBULATORY_CARE_PROVIDER_SITE_OTHER): Payer: Self-pay | Admitting: Nurse Practitioner

## 2023-11-24 MED ORDER — CLOPIDOGREL BISULFATE 75 MG PO TABS: 75.0000 mg | ORAL_TABLET | Freq: Every day | ORAL | 3 refills | Status: AC

## 2023-12-15 ENCOUNTER — Encounter: Payer: Self-pay | Admitting: Cardiology

## 2023-12-15 ENCOUNTER — Ambulatory Visit: Attending: Cardiology | Admitting: Cardiology

## 2023-12-15 VITALS — BP 130/86 | HR 73 | Ht 74.0 in | Wt 188.0 lb

## 2023-12-15 DIAGNOSIS — N289 Disorder of kidney and ureter, unspecified: Secondary | ICD-10-CM | POA: Insufficient documentation

## 2023-12-15 DIAGNOSIS — N1831 Chronic kidney disease, stage 3a: Secondary | ICD-10-CM

## 2023-12-15 DIAGNOSIS — I10 Essential (primary) hypertension: Secondary | ICD-10-CM

## 2023-12-15 DIAGNOSIS — E782 Mixed hyperlipidemia: Secondary | ICD-10-CM | POA: Diagnosis not present

## 2023-12-15 DIAGNOSIS — I7090 Unspecified atherosclerosis: Secondary | ICD-10-CM

## 2023-12-15 DIAGNOSIS — Z9889 Other specified postprocedural states: Secondary | ICD-10-CM

## 2023-12-15 NOTE — Patient Instructions (Signed)
Medication Instructions:  Your physician has recommended you make the following change in your medication:   Stop Aspirin  *If you need a refill on your cardiac medications before your next appointment, please call your pharmacy*   Lab Work: None ordered If you have labs (blood work) drawn today and your tests are completely normal, you will receive your results only by: MyChart Message (if you have MyChart) OR A paper copy in the mail If you have any lab test that is abnormal or we need to change your treatment, we will call you to review the results.   Testing/Procedures: None ordered   Follow-Up: At [facility], you and your health needs are our priority.  As part of our continuing mission to provide you with exceptional heart care, we have created designated Provider Care Teams.  These Care Teams include your primary Cardiologist (physician) and Advanced Practice Providers (APPs -  Physician Assistants and Nurse Practitioners) who all work together to provide you with the care you need, when you need it.  We recommend signing up for the patient portal called "MyChart".  Sign up information is provided on this After Visit Summary.  MyChart is used to connect with patients for Virtual Visits (Telemedicine).  Patients are able to view lab/test results, encounter notes, upcoming appointments, etc.  Non-urgent messages can be sent to your provider as well.   To learn more about what you can do with MyChart, go to [email or web address].    Your next appointment:   9 month(s)  The format for your next appointment:   In Person  Provider:   Belva Crome, MD    Other Instructions none  Important Information About Sugar

## 2023-12-15 NOTE — Progress Notes (Signed)
 Cardiology Office Note:    Date:  12/15/2023   ID:  Robert Roy, DOB 15-Nov-1945, MRN [id number]  PCP:  Angelique Barer, MD  Cardiologist:  Nelia Balzarine, MD   Referring MD: Angelique Barer, MD    ASSESSMENT:    1. Hypertension, benign   2. Atherosclerotic vascular disease   3. Mixed dyslipidemia   4. Renal insufficiency   5. Stage 3a chronic kidney disease (CKD) (HCC)   6. Status post carotid endarterectomy    PLAN:    In order of problems listed above:  Atherosclerotic vascular disease: Secondary prevention stressed with the patient.  Importance of compliance with diet medication stressed and vocalized understanding.  He was advised to walk at least half an hour a day on a daily basis. Mixed dyslipidemia: On lipid-lowering medications followed by primary care.  Lipids were reviewed from the [facility] portal and found to be fine. Essential hypertension: Blood pressure stable and diet was emphasized. Renal insufficiency: Stable and managed by primary care.  Caution advised. Patient will be seen in follow-up appointment in 6 months or earlier if the patient has any concerns.    Medication Adjustments/Labs and Tests Ordered: Current medicines are reviewed at length with the patient today.  Concerns regarding medicines are outlined above.  Orders Placed This Encounter  Procedures   EKG 12-Lead   No orders of the defined types were placed in this encounter.    Chief Complaint  Patient presents with   Follow-up     History of Present Illness:    Robert Roy is a 78 y.o. male.  Patient is post carotid endarterectomy, essential hypertension, mixed dyslipidemia and renal insufficiency.  He denies any problems at this time and takes care of activities of daily living.  No chest pain orthopnea or PND.  He walks on a regular basis.  At the time of my evaluation, the patient is alert awake oriented and in no distress.  Past Medical History:  Diagnosis Date   Allergic rhinitis     Anxiety    Atherosclerotic vascular disease 01/03/2019   B12 deficiency    BMI 23.0-23.9, adult    BMI 28.0-28.9,adult    BPH (benign prostatic hyperplasia)    Carotid artery stenosis, asymptomatic, right    Carotid stenosis 08/09/2018   Carotid stenosis, right 09/28/2018   Chronic constipation    Dizziness    Dyslipidemia    Dysphagia    Dyspnea    Frequent PVCs    Generalized osteoarthritis    History of kidney stones    h/o   Hypertension, benign    Hypogonadism male    Hypothyroidism    Lower extremity edema    Mixed dyslipidemia 01/03/2019   PVC (premature ventricular contraction) 01/16/2018   Stage 3a chronic kidney disease (CKD) (HCC)    Status post carotid endarterectomy 01/03/2019   Syncope     Past Surgical History:  Procedure Laterality Date   ENDARTERECTOMY Right 09/28/2018   Procedure: ENDARTERECTOMY CAROTID;  Surgeon: Celso College, MD;  Location: [facility] ORS;  Service: Vascular;  Laterality: Right;   I & D EXTREMITY Right 10/14/2018   Procedure: exploration right neck,drainage abscess,drain placement, carotid patch removal,saphenous vein harvest;  Surgeon: Everitt Hoa, MD;  Location: [facility] ORS;  Service: Vascular;  Laterality: Right;   KIDNEY STONE SURGERY      Current Medications: Current Meds  Medication Sig   amLODipine  (NORVASC ) 10 MG tablet Take 10 mg by mouth daily.   clopidogrel  (PLAVIX )  75 MG tablet Take 1 tablet (75 mg total) by mouth daily.   finasteride (PROSCAR) 5 MG tablet Take 5 mg by mouth daily.   rosuvastatin  (CRESTOR ) 10 MG tablet TAKE ONE TABLET BY MOUTH DAILY   tamsulosin  (FLOMAX ) 0.4 MG CAPS capsule Take 1 capsule by mouth daily.   testosterone  cypionate (DEPOTESTOSTERONE CYPIONATE) 200 MG/ML injection Inject 200 mg into the muscle every 14 (fourteen) days.   [DISCONTINUED] ASPIRIN  LOW DOSE 81 MG tablet TAKE ONE TABLET BY MOUTH EVERY DAY     Allergies:   Testosterone  enanthate   Social History   Socioeconomic History   Marital  status: Married    Spouse name: Not on file   Number of children: Not on file   Years of education: Not on file   Highest education level: Not on file  Occupational History   Not on file  Tobacco Use   Smoking status: Never   Smokeless tobacco: Never  Vaping Use   Vaping status: Never Used  Substance and Sexual Activity   Alcohol use: Never   Drug use: Never   Sexual activity: Not on file  Other Topics Concern   Not on file  Social History Narrative   Not on file   Social Drivers of Health   Financial Resource Strain: Not on file  Food Insecurity: Not on file  Transportation Needs: Not on file  Physical Activity: Not on file  Stress: Not on file  Social Connections: Not on file     Family History: The patient's family history includes Heart disease in his father; Hypertension in his mother; Prostate cancer in his father.  ROS:   Please see the history of present illness.    All other systems reviewed and are negative.  EKGs/Labs/Other Studies Reviewed:    The following studies were reviewed today: I discussed my findings with the patient at length   Recent Labs: No results found for requested labs within last 365 days.  Recent Lipid Panel    Component Value Date/Time   CHOL 126 07/28/2021 0924   TRIG 41 07/28/2021 0924   HDL 58 07/28/2021 0924   CHOLHDL 2.2 07/28/2021 0924   LDLCALC 58 07/28/2021 0924    Physical Exam:    VS:  BP 130/86   Pulse 73   Ht 6' 2 (1.88 m)   Wt 188 lb (85.3 kg)   SpO2 95%   BMI 24.14 kg/m     Wt Readings from Last 3 Encounters:  12/15/23 188 lb (85.3 kg)  02/25/23 186 lb (84.4 kg)  06/18/22 194 lb (88 kg)     GEN: Patient is in no acute distress HEENT: Normal NECK: No JVD; No carotid bruits LYMPHATICS: No lymphadenopathy CARDIAC: Hear sounds regular, 2/6 systolic murmur at the apex. RESPIRATORY:  Clear to auscultation without rales, wheezing or rhonchi  ABDOMEN: Soft, non-tender, non-distended MUSCULOSKELETAL:   No edema; No deformity  SKIN: Warm and dry NEUROLOGIC:  Alert and oriented x 3 PSYCHIATRIC:  Normal affect   Signed, Nelia Balzarine, MD  12/15/2023 8:57 AM    [facility]

## 2024-03-13 ENCOUNTER — Telehealth: Payer: Self-pay

## 2024-03-13 ENCOUNTER — Telehealth: Payer: Self-pay | Admitting: *Deleted

## 2024-03-13 NOTE — Telephone Encounter (Signed)
 Pt has been scheduled tele preop appt 03/20/24. Med rec and consent are done.     Patient Consent for Virtual Visit        Robert Roy has provided verbal consent on 03/13/2024 for a virtual visit (video or telephone).   CONSENT FOR VIRTUAL VISIT FOR:  Robert Roy  By participating in this virtual visit I agree to the following:  I hereby voluntarily request, consent and authorize [facility] and its employed or contracted physicians, physician assistants, nurse practitioners or other licensed health care professionals (the Practitioner), to provide me with telemedicine health care services (the "Services) as deemed necessary by the treating Practitioner. I acknowledge and consent to receive the Services by the Practitioner via telemedicine. I understand that the telemedicine visit will involve communicating with the Practitioner through live audiovisual communication technology and the disclosure of certain medical information by electronic transmission. I acknowledge that I have been given the opportunity to request an in-person assessment or other available alternative prior to the telemedicine visit and am voluntarily participating in the telemedicine visit.  I understand that I have the right to withhold or withdraw my consent to the use of telemedicine in the course of my care at any time, without affecting my right to future care or treatment, and that the Practitioner or I may terminate the telemedicine visit at any time. I understand that I have the right to inspect all information obtained and/or recorded in the course of the telemedicine visit and may receive copies of available information for a reasonable fee.  I understand that some of the potential risks of receiving the Services via telemedicine include:  Delay or interruption in medical evaluation due to technological equipment failure or disruption; Information transmitted may not be sufficient (e.g. poor resolution of  images) to allow for appropriate medical decision making by the Practitioner; and/or  In rare instances, security protocols could fail, causing a breach of personal health information.  Furthermore, I acknowledge that it is my responsibility to provide information about my medical history, conditions and care that is complete and accurate to the best of my ability. I acknowledge that Practitioner's advice, recommendations, and/or decision may be based on factors not within their control, such as incomplete or inaccurate data provided by me or distortions of diagnostic images or specimens that may result from electronic transmissions. I understand that the practice of medicine is not an exact science and that Practitioner makes no warranties or guarantees regarding treatment outcomes. I acknowledge that a copy of this consent can be made available to me via my patient portal [facility] MyChart), or I can request a printed copy by calling the office of [facility].    I understand that my insurance will be billed for this visit.   I have read or had this consent read to me. I understand the contents of this consent, which adequately explains the benefits and risks of the Services being provided via telemedicine.  I have been provided ample opportunity to ask questions regarding this consent and the Services and have had my questions answered to my satisfaction. I give my informed consent for the services to be provided through the use of telemedicine in my medical care

## 2024-03-13 NOTE — Telephone Encounter (Signed)
   Name: Robert Roy  DOB: [birth year]  MRN: [id number]  Primary Cardiologist: None   Preoperative team, please contact this patient and set up a phone call appointment for further preoperative risk assessment. Please obtain consent and complete medication review. Thank you for your help.  I confirm that guidance regarding antiplatelet and oral anticoagulation therapy has been completed and, if necessary, noted below.  Patient's Plavix  is not prescribed by cardiology.  Recommendations for holding Plavix  will need to come from prescribing provider.  I also confirmed the patient resides in the state of [place] . As per [facility] Medical Board telemedicine laws, the patient must reside in the state in which the provider is licensed.   Josefa CHRISTELLA Beauvais, NP 03/13/2024, 11:30 AM [facility]

## 2024-03-13 NOTE — Telephone Encounter (Signed)
 PRIMARY CARD REVANKAR  Pt has been scheduled tele preop appt 03/20/24. Med rec and consent are done.

## 2024-03-13 NOTE — Telephone Encounter (Signed)
   Pre-operative Risk Assessment    Patient Name: Robert Roy  DOB: [birth year] MRN: [id number]   Date of last office visit: 12/15/23 JENNIFER CRAPE, MD Date of next office visit: NONE   Request for Surgical Clearance    Procedure:  OPEN REPAIR OF RIGHT INGUINAL HERNIA WITH MESH  Date of Surgery:  Clearance 03/29/24                                Surgeon:  DR LYNWOOD SEARCH Surgeon's Group or Practice Name:  [facility] Phone number:  [phone] Fax number:  [phone]   Type of Clearance Requested:   - Medical  - Pharmacy:  Hold Clopidogrel  (Plavix )     Type of Anesthesia:  General    Additional requests/questions:    SignedLucie DELENA Ku   03/13/2024, 10:23 AM

## 2024-03-19 ENCOUNTER — Telehealth (INDEPENDENT_AMBULATORY_CARE_PROVIDER_SITE_OTHER): Payer: Self-pay | Admitting: Vascular Surgery

## 2024-03-19 NOTE — Telephone Encounter (Signed)
 Robert Roy from Dr [name] called office regarding the holding Plavix  for open repair of right inguinal hernia with mesh on 03/29/2024.  Robert Roy stated she have faxed the request to the office on 03/14/2024.  She will be re-faxing the blood thinner request.

## 2024-03-19 NOTE — Telephone Encounter (Signed)
 2nd request received.  Pt has been scheduled for TELE Preop appt 03/20/24.  Will update the surgeons office.

## 2024-03-20 ENCOUNTER — Ambulatory Visit: Attending: Cardiology

## 2024-03-20 DIAGNOSIS — Z01810 Encounter for preprocedural cardiovascular examination: Secondary | ICD-10-CM | POA: Diagnosis not present

## 2024-03-20 NOTE — Telephone Encounter (Signed)
 Request has been faxed.

## 2024-03-20 NOTE — Progress Notes (Signed)
 Virtual Visit via Telephone Note   Because of [name] co-morbid illnesses, he is at least at moderate risk for complications without adequate follow up.  This format is felt to be most appropriate for this patient at this time.  Due to technical limitations with video connection (technology), today's appointment will be conducted as an audio only telehealth visit, and [name] verbally agreed to proceed in this manner.   All issues noted in this document were discussed and addressed.  No physical exam could be performed with this format.  Evaluation Performed:  Preoperative cardiovascular risk assessment _____________   Date:  03/20/2024   Patient ID:  Robert Roy, DOB May 10, 1946, MRN [id number] Patient Location:  Home Provider location:   Office  Primary Care Provider:  Jama Chow, MD Primary Cardiologist:  None  Chief Complaint / Patient Profile  78 y.o. y/o male with a h/o carotid artery disease s/p carotid endarterectomy, hypertension, dyslipidemia and renal insufficiency who is pending open repair of right inguinal hernia with mesh with Dr. Lynwood Search and presents today for telephonic preoperative cardiovascular risk assessment. History of Present Illness  Robert Roy is a 78 y.o. male who presents via audio/video conferencing for a telehealth visit today.  Pt was last seen in cardiology clinic on 12/15/2023 by Dr. Revankar.  At that time Robert Roy was doing well.  The patient is now pending procedure as outlined above. Since his last visit, he has remained stable from a cardiac standpoint. Today he denies chest pain, shortness of breath, lower extremity edema, fatigue, palpitations, melena, hematuria, hemoptysis, diaphoresis, weakness, presyncope, syncope, orthopnea, and PND.  He is able to achieve greater than 4 METS of activity, he continues to work, complete his own ADLs, household chores and complete yard work. Past Medical History    Past Medical History:   Diagnosis Date   Allergic rhinitis    Anxiety    Atherosclerotic vascular disease 01/03/2019   B12 deficiency    BMI 23.0-23.9, adult    BMI 28.0-28.9,adult    BPH (benign prostatic hyperplasia)    Carotid artery stenosis, asymptomatic, right    Carotid stenosis 08/09/2018   Carotid stenosis, right 09/28/2018   Chronic constipation    Dizziness    Dyslipidemia    Dysphagia    Dyspnea    Frequent PVCs    Generalized osteoarthritis    History of kidney stones    h/o   Hypertension, benign    Hypogonadism male    Hypothyroidism    Lower extremity edema    Mixed dyslipidemia 01/03/2019   PVC (premature ventricular contraction) 01/16/2018   Stage 3a chronic kidney disease (CKD) (HCC)    Status post carotid endarterectomy 01/03/2019   Syncope    Past Surgical History:  Procedure Laterality Date   ENDARTERECTOMY Right 09/28/2018   Procedure: ENDARTERECTOMY CAROTID;  Surgeon: Marea Selinda RAMAN, MD;  Location: [facility];  Service: Vascular;  Laterality: Right;   I & D EXTREMITY Right 10/14/2018   Procedure: exploration right neck,drainage abscess,drain placement, carotid patch removal,saphenous vein harvest;  Surgeon: Clarice Emery LABOR, MD;  Location: [facility];  Service: Vascular;  Laterality: Right;   KIDNEY STONE SURGERY     Allergies Allergies  Allergen Reactions   Testosterone  Enanthate Rash   Home Medications    Prior to Admission medications   Medication Sig Start Date End Date Taking? Authorizing Provider  amLODipine  (NORVASC ) 10 MG tablet Take 10 mg by mouth daily. 11/16/23  [provider]  clopidogrel  (PLAVIX ) 75 MG tablet Take 1 tablet (75 mg total) by mouth daily. 11/24/23   Brown, Fallon E, NP  finasteride (PROSCAR) 5 MG tablet Take 5 mg by mouth daily. 10/09/19   [provider]  rosuvastatin  (CRESTOR ) 10 MG tablet TAKE ONE TABLET BY MOUTH DAILY 11/02/23   Revankar, Rajan R, MD  tamsulosin  (FLOMAX ) 0.4 MG CAPS capsule Take 1 capsule by mouth daily.  11/13/18   [provider]  testosterone  cypionate (DEPOTESTOSTERONE CYPIONATE) 200 MG/ML injection Inject 200 mg into the muscle every 14 (fourteen) days. 12/14/23   [provider]   Physical Exam  Vital Signs:  Robert Roy does not have vital signs available for review today. Given telephonic nature of communication, physical exam is limited. AAOx3. NAD. Normal affect.  Speech and respirations are unlabored. Accessory Clinical Findings  None Assessment & Plan    1.  Preoperative Cardiovascular Risk Assessment: Robert Roy's perioperative risk of a major cardiac event is 0.9% according to the Revised Cardiac Risk Index (RCRI).  Therefore, he is at low risk for perioperative complications.   His functional capacity is good at 8.33 METs according to the Duke Activity Status Index (DASI). Recommendations: According to ACC/AHA guidelines, no further cardiovascular testing needed.  The patient may proceed to surgery at acceptable risk.   Antiplatelet and/or Anticoagulation Recommendations: Patient's Plavix  is not prescribed by cardiology. Recommendations for holding Plavix  will need to come from prescribing provider.   The patient was advised that if he develops new symptoms prior to surgery to contact our office to arrange for a follow-up visit, and he verbalized understanding.  A copy of this note will be routed to requesting surgeon.  Time:   Today, I have spent 10 minutes with the patient with telehealth technology discussing medical history, symptoms, and management plan.    Katriana Dortch D Elroy Schembri, NP  03/20/2024, 11:47 AM

## 2024-04-30 ENCOUNTER — Other Ambulatory Visit: Payer: Self-pay | Admitting: Cardiology

## 2024-06-07 ENCOUNTER — Other Ambulatory Visit (INDEPENDENT_AMBULATORY_CARE_PROVIDER_SITE_OTHER): Payer: Self-pay | Admitting: Vascular Surgery

## 2024-06-07 DIAGNOSIS — I6523 Occlusion and stenosis of bilateral carotid arteries: Secondary | ICD-10-CM

## 2024-06-19 ENCOUNTER — Encounter (INDEPENDENT_AMBULATORY_CARE_PROVIDER_SITE_OTHER): Payer: Self-pay | Admitting: Vascular Surgery

## 2024-06-19 ENCOUNTER — Other Ambulatory Visit (INDEPENDENT_AMBULATORY_CARE_PROVIDER_SITE_OTHER): Payer: Medicare HMO

## 2024-06-19 ENCOUNTER — Ambulatory Visit (INDEPENDENT_AMBULATORY_CARE_PROVIDER_SITE_OTHER): Payer: Medicare HMO | Admitting: Vascular Surgery

## 2024-06-19 DIAGNOSIS — I6523 Occlusion and stenosis of bilateral carotid arteries: Secondary | ICD-10-CM | POA: Diagnosis not present

## 2024-06-19 DIAGNOSIS — I10 Essential (primary) hypertension: Secondary | ICD-10-CM | POA: Diagnosis not present

## 2024-06-19 DIAGNOSIS — E782 Mixed hyperlipidemia: Secondary | ICD-10-CM

## 2024-06-19 NOTE — Assessment & Plan Note (Signed)
 lipid control important in reducing the progression of atherosclerotic disease. Continue statin therapy

## 2024-06-19 NOTE — Progress Notes (Signed)
 MRN : [id number]  Robert Roy is a 78 y.o. (1945-12-20) male who presents with chief complaint of  Chief Complaint  Patient presents with   Follow-up    1 year follow up + carotid   .  History of Present Illness:  Discussed the use of AI scribe software for clinical note transcription with the patient, who gave verbal consent to proceed.  History of Present Illness He is a 78 year old male who presents for routine follow-up after a carotid endarterectomy.  He underwent a carotid endarterectomy six years ago and has had no interval symptoms or complications since his last visit. An ultrasound today shows 1-39% stenosis on the left side which is stable. Right CEA is widely patent.    Results RADIOLOGY Carotid Ultrasound: Patent right CEA, Left side stenosis 1-39%, no increased risk of cerebrovascular accident (CVA).  Current Outpatient Medications  Medication Sig Dispense Refill   amLODipine  (NORVASC ) 10 MG tablet Take 10 mg by mouth daily.     ASPIRIN  LOW DOSE 81 MG tablet Take 81 mg by mouth daily.     clopidogrel  (PLAVIX ) 75 MG tablet Take 1 tablet (75 mg total) by mouth daily. 90 tablet 3   finasteride (PROSCAR) 5 MG tablet Take 5 mg by mouth daily.     rosuvastatin  (CRESTOR ) 10 MG tablet Take 1 tablet (10 mg total) by mouth daily. 90 tablet 1   tamsulosin  (FLOMAX ) 0.4 MG CAPS capsule Take 1 capsule by mouth daily.     testosterone  cypionate (DEPOTESTOSTERONE CYPIONATE) 200 MG/ML injection Inject 200 mg into the muscle every 14 (fourteen) days.     No current facility-administered medications for this visit.    Past Medical History:  Diagnosis Date   Allergic rhinitis    Anxiety    Atherosclerotic vascular disease 01/03/2019   B12 deficiency    BMI 23.0-23.9, adult    BMI 28.0-28.9,adult    BPH (benign prostatic hyperplasia)    Carotid artery stenosis, asymptomatic, right    Carotid stenosis 08/09/2018   Carotid stenosis, right 09/28/2018   Chronic  constipation    Dizziness    Dyslipidemia    Dysphagia    Dyspnea    Frequent PVCs    Generalized osteoarthritis    History of kidney stones    h/o   Hypertension, benign    Hypogonadism male    Hypothyroidism    Lower extremity edema    Mixed dyslipidemia 01/03/2019   PVC (premature ventricular contraction) 01/16/2018   Stage 3a chronic kidney disease (CKD) (HCC)    Status post carotid endarterectomy 01/03/2019   Syncope     Past Surgical History:  Procedure Laterality Date   ENDARTERECTOMY Right 09/28/2018   Procedure: ENDARTERECTOMY CAROTID;  Surgeon: Marea Selinda RAMAN, MD;  Location: [facility];  Service: Vascular;  Laterality: Right;   I & D EXTREMITY Right 10/14/2018   Procedure: exploration right neck,drainage abscess,drain placement, carotid patch removal,saphenous vein harvest;  Surgeon: Clarice Emery LABOR, MD;  Location: [facility];  Service: Vascular;  Laterality: Right;   KIDNEY STONE SURGERY       Social History[1]     Family History  Problem Relation Age of Onset   Hypertension Mother    Heart disease Father    Prostate cancer Father      Allergies[2]   REVIEW OF SYSTEMS (Negative unless checked)  Constitutional: [] Weight loss  [] Fever  [] Chills Cardiac: [] Chest pain   [] Chest pressure   [] Palpitations   [] Shortness of  breath when laying flat   [] Shortness of breath at rest   [] Shortness of breath with exertion. Vascular:  [] Pain in legs with walking   [] Pain in legs at rest   [] Pain in legs when laying flat   [] Claudication   [] Pain in feet when walking  [] Pain in feet at rest  [] Pain in feet when laying flat   [] History of DVT   [] Phlebitis   [] Swelling in legs   [] Varicose veins   [] Non-healing ulcers Pulmonary:   [] Uses home oxygen   [] Productive cough   [] Hemoptysis   [] Wheeze  [] COPD   [] Asthma Neurologic:  [] Dizziness  [] Blackouts   [] Seizures   [] History of stroke   [] History of TIA  [] Aphasia   [] Temporary blindness   [] Dysphagia   [] Weakness or numbness  in arms   [] Weakness or numbness in legs Musculoskeletal:  [] Arthritis   [] Joint swelling   [] Joint pain   [] Low back pain Hematologic:  [] Easy bruising  [] Easy bleeding   [] Hypercoagulable state   [] Anemic  [] Hepatitis Gastrointestinal:  [] Blood in stool   [] Vomiting blood  [] Gastroesophageal reflux/heartburn   [] Difficulty swallowing. Genitourinary:  [] Chronic kidney disease   [] Difficult urination  [] Frequent urination  [] Burning with urination   [] Blood in urine Skin:  [] Rashes   [] Ulcers   [] Wounds Psychological:  [x] History of anxiety   []  History of major depression.  Physical Examination  There were no vitals filed for this visit. There is no height or weight on file to calculate BMI. Gen:  WD/WN, NAD Head: /AT, No temporalis wasting. Ear/Nose/Throat: Hearing grossly intact, nares w/o erythema or drainage, trachea midline Eyes: Conjunctiva clear. Sclera non-icteric Neck: Supple.  No bruit  Pulmonary:  Good air movement, equal and clear to auscultation bilaterally.  Cardiac: RRR, No JVD Vascular:  Vessel Right Left  Radial Palpable Palpable       Musculoskeletal: M/S 5/5 throughout.  No deformity or atrophy.  Neurologic: CN 2-12 intact. Sensation grossly intact in extremities.  Symmetrical.  Speech is fluent. Motor exam as listed above. Psychiatric: Judgment intact, Mood & affect appropriate for pt's clinical situation. Dermatologic: No rashes or ulcers noted.  No cellulitis or open wounds. Lymph : No Cervical, Axillary, or Inguinal lymphadenopathy.  Physical Exam    CBC Lab Results  Component Value Date   WBC 5.6 07/28/2021   HGB 14.6 07/28/2021   HCT 42.9 07/28/2021   MCV 92 07/28/2021   PLT 168 07/28/2021    BMET    Component Value Date/Time   NA 143 07/28/2021 0924   K 4.3 07/28/2021 0924   CL 108 (H) 07/28/2021 0924   CO2 26 07/28/2021 0924   GLUCOSE 88 07/28/2021 0924   GLUCOSE 104 (H) 10/16/2018 0338   BUN 20 07/28/2021 0924   CREATININE 1.32 (H)  07/28/2021 0924   CALCIUM  10.5 (H) 07/28/2021 0924   GFRNONAA 57 (L) 06/03/2020 0837   GFRAA 66 06/03/2020 0837   CrCl cannot be calculated (Patient's most recent lab result is older than the maximum 21 days allowed.).  COAG Lab Results  Component Value Date   INR 1.1 10/14/2018   INR 1.1 09/21/2018    Radiology No results found.   Assessment/Plan  Assessment & Plan Bilateral carotid artery stenosis Left carotid artery stenosis remains mild at 1-39%, right CEA is patent without recurrent stenosis with no increased stroke risk. - Continue annual ultrasound monitoring of carotid arteries. No change in medications.    Hypertension, benign blood pressure control important in  reducing the progression of atherosclerotic disease. On appropriate oral medications.   Mixed dyslipidemia lipid control important in reducing the progression of atherosclerotic disease. Continue statin therapy      Selinda Gu, MD  06/19/2024 12:24 PM    This note was created with Dragon medical transcription system.  Any errors from dictation are purely unintentional     [1]  Social History Tobacco Use   Smoking status: Never   Smokeless tobacco: Never  Vaping Use   Vaping status: Never Used  Substance Use Topics   Alcohol use: Never   Drug use: Never  [2]  Allergies Allergen Reactions   Testosterone  Enanthate Rash

## 2024-06-19 NOTE — Assessment & Plan Note (Signed)
 blood pressure control important in reducing the progression of atherosclerotic disease. On appropriate oral medications.

## 2024-09-12 ENCOUNTER — Ambulatory Visit: Admitting: Cardiology

## 2025-06-18 ENCOUNTER — Encounter (INDEPENDENT_AMBULATORY_CARE_PROVIDER_SITE_OTHER)

## 2025-06-18 ENCOUNTER — Ambulatory Visit (INDEPENDENT_AMBULATORY_CARE_PROVIDER_SITE_OTHER): Admitting: Vascular Surgery
